# Patient Record
Sex: Male | Born: 1945
Health system: Southern US, Community
[De-identification: ages and names within clinical notes are randomized; demographics above are authoritative.]

## PROBLEM LIST (undated history)

## (undated) DIAGNOSIS — R0602 Shortness of breath: Secondary | ICD-10-CM

## (undated) DIAGNOSIS — I959 Hypotension, unspecified: Secondary | ICD-10-CM

## (undated) DIAGNOSIS — I739 Peripheral vascular disease, unspecified: Secondary | ICD-10-CM

## (undated) DIAGNOSIS — J189 Pneumonia, unspecified organism: Secondary | ICD-10-CM

## (undated) DIAGNOSIS — Z9289 Personal history of other medical treatment: Secondary | ICD-10-CM

## (undated) DIAGNOSIS — M1990 Unspecified osteoarthritis, unspecified site: Secondary | ICD-10-CM

## (undated) DIAGNOSIS — E785 Hyperlipidemia, unspecified: Secondary | ICD-10-CM

## (undated) DIAGNOSIS — I2510 Atherosclerotic heart disease of native coronary artery without angina pectoris: Secondary | ICD-10-CM

## (undated) DIAGNOSIS — I6529 Occlusion and stenosis of unspecified carotid artery: Secondary | ICD-10-CM

## (undated) DIAGNOSIS — I4891 Unspecified atrial fibrillation: Secondary | ICD-10-CM

## (undated) DIAGNOSIS — I495 Sick sinus syndrome: Secondary | ICD-10-CM

## (undated) DIAGNOSIS — N200 Calculus of kidney: Secondary | ICD-10-CM

## (undated) HISTORY — DX: Personal history of other medical treatment: Z92.89

## (undated) HISTORY — DX: Occlusion and stenosis of unspecified carotid artery: I65.29

## (undated) HISTORY — DX: Hyperlipidemia, unspecified: E78.5

## (undated) HISTORY — PX: CYSTOSCOPY/RETROGRADE/URETEROSCOPY/STONE EXTRACTION WITH BASKET: SHX5317

## (undated) HISTORY — DX: Peripheral vascular disease, unspecified: I73.9

---

## 2001-12-29 ENCOUNTER — Encounter: Payer: Self-pay | Admitting: Family Medicine

## 2001-12-29 ENCOUNTER — Ambulatory Visit (HOSPITAL_COMMUNITY): Admission: RE | Admit: 2001-12-29 | Discharge: 2001-12-29 | Payer: Self-pay | Admitting: Family Medicine

## 2003-08-27 ENCOUNTER — Ambulatory Visit (HOSPITAL_COMMUNITY): Admission: RE | Admit: 2003-08-27 | Discharge: 2003-08-27 | Payer: Self-pay | Admitting: Family Medicine

## 2004-11-24 ENCOUNTER — Ambulatory Visit (HOSPITAL_COMMUNITY): Admission: RE | Admit: 2004-11-24 | Discharge: 2004-11-24 | Payer: Self-pay | Admitting: Family Medicine

## 2005-12-29 ENCOUNTER — Ambulatory Visit (HOSPITAL_COMMUNITY): Admission: RE | Admit: 2005-12-29 | Discharge: 2005-12-29 | Payer: Self-pay | Admitting: Family Medicine

## 2007-08-31 ENCOUNTER — Ambulatory Visit (HOSPITAL_COMMUNITY): Admission: RE | Admit: 2007-08-31 | Discharge: 2007-08-31 | Payer: Self-pay | Admitting: Family Medicine

## 2007-10-25 ENCOUNTER — Emergency Department (HOSPITAL_COMMUNITY): Admission: EM | Admit: 2007-10-25 | Discharge: 2007-10-25 | Payer: Self-pay | Admitting: Emergency Medicine

## 2009-04-17 ENCOUNTER — Ambulatory Visit (HOSPITAL_COMMUNITY): Admission: RE | Admit: 2009-04-17 | Discharge: 2009-04-17 | Payer: Self-pay | Admitting: Family Medicine

## 2009-04-24 ENCOUNTER — Ambulatory Visit (HOSPITAL_COMMUNITY): Admission: RE | Admit: 2009-04-24 | Discharge: 2009-04-24 | Payer: Self-pay | Admitting: Family Medicine

## 2009-04-29 ENCOUNTER — Ambulatory Visit: Payer: Self-pay | Admitting: Vascular Surgery

## 2009-04-29 ENCOUNTER — Inpatient Hospital Stay (HOSPITAL_COMMUNITY): Admission: RE | Admit: 2009-04-29 | Discharge: 2009-05-02 | Payer: Self-pay | Admitting: Family Medicine

## 2009-05-01 ENCOUNTER — Encounter: Payer: Self-pay | Admitting: Vascular Surgery

## 2009-05-21 ENCOUNTER — Ambulatory Visit: Payer: Self-pay | Admitting: Vascular Surgery

## 2009-05-21 HISTORY — PX: CAROTID ENDARTERECTOMY: SUR193

## 2009-08-25 DIAGNOSIS — Z9289 Personal history of other medical treatment: Secondary | ICD-10-CM

## 2009-08-25 HISTORY — DX: Personal history of other medical treatment: Z92.89

## 2009-08-25 HISTORY — PX: CARDIAC CATHETERIZATION: SHX172

## 2009-09-01 ENCOUNTER — Inpatient Hospital Stay (HOSPITAL_COMMUNITY): Admission: EM | Admit: 2009-09-01 | Discharge: 2009-09-03 | Payer: Self-pay | Admitting: Emergency Medicine

## 2009-09-02 ENCOUNTER — Encounter (INDEPENDENT_AMBULATORY_CARE_PROVIDER_SITE_OTHER): Payer: Self-pay | Admitting: Cardiology

## 2009-11-05 ENCOUNTER — Ambulatory Visit: Payer: Self-pay | Admitting: Vascular Surgery

## 2010-11-05 ENCOUNTER — Ambulatory Visit: Admit: 2010-11-05 | Payer: Self-pay | Admitting: Vascular Surgery

## 2010-12-10 ENCOUNTER — Other Ambulatory Visit (INDEPENDENT_AMBULATORY_CARE_PROVIDER_SITE_OTHER): Payer: BC Managed Care – PPO

## 2010-12-10 DIAGNOSIS — Z48812 Encounter for surgical aftercare following surgery on the circulatory system: Secondary | ICD-10-CM

## 2010-12-10 DIAGNOSIS — I6529 Occlusion and stenosis of unspecified carotid artery: Secondary | ICD-10-CM

## 2010-12-25 NOTE — Procedures (Unsigned)
CAROTID DUPLEX EXAM  INDICATION:  Followup left carotid endarterectomy.  HISTORY: Diabetes:  no Cardiac:  Atrial fibrillation Hypertension:  yes Smoking:  yes Previous Surgery:  Left carotid endarterectomy 05/21/2009 CV History:  The patient is currently asymptomatic Amaurosis Fugax No, Paresthesias No, Hemiparesis No                                      RIGHT             LEFT Brachial systolic pressure:         114               120 Brachial Doppler waveforms:         WNL               WNL Vertebral direction of flow:        Antegrade         Antegrade DUPLEX VELOCITIES (cm/sec) CCA peak systolic                   79                70 ECA peak systolic                   83                68 ICA peak systolic                   84                120 ICA end diastolic                   33                33 PLAQUE MORPHOLOGY:                  Soft plaque, mixed PLAQUE AMOUNT:                      Mild PLAQUE LOCATION:                    CCA / ICA / ECA  IMPRESSION: 1. Right side no hemodynamically significant stenosis. 2. Left internal carotid artery is 40% to 59% stenosis.  This could be     secondary to vessel tortuosity.  Study stable compared to previous.  ___________________________________________ Janetta Hora Fields, MD  OD/MEDQ  D:  12/10/2010  T:  12/10/2010  Job:  [id number]

## 2011-01-27 LAB — DIFFERENTIAL
Basophils Relative: 1 % (ref 0–1)
Lymphs Abs: 2.6 10*3/uL (ref 0.7–4.0)
Monocytes Absolute: 0.5 10*3/uL (ref 0.1–1.0)
Monocytes Relative: 6 % (ref 3–12)
Neutro Abs: 5.5 10*3/uL (ref 1.7–7.7)

## 2011-01-27 LAB — BASIC METABOLIC PANEL
BUN: 16 mg/dL (ref 6–23)
CO2: 25 mEq/L (ref 19–32)
Calcium: 8.4 mg/dL (ref 8.4–10.5)
Calcium: 8.5 mg/dL (ref 8.4–10.5)
Chloride: 110 mEq/L (ref 96–112)
Creatinine, Ser: 1.28 mg/dL (ref 0.4–1.5)
GFR calc Af Amer: >60 mL/min (ref >60)
GFR calc Af Amer: >60 mL/min (ref >60)
GFR calc non Af Amer: 59 mL/min — ABNORMAL LOW (ref >60)
Potassium: 3.9 mEq/L (ref 3.5–5.1)
Sodium: 138 mEq/L (ref 135–145)

## 2011-01-27 LAB — URINALYSIS, ROUTINE W REFLEX MICROSCOPIC
Bilirubin Urine: NEGATIVE
Glucose, UA: NEGATIVE mg/dL
Hgb urine dipstick: NEGATIVE
Ketones, ur: NEGATIVE mg/dL
Protein, ur: NEGATIVE mg/dL

## 2011-01-27 LAB — CBC
Hemoglobin: 13.3 g/dL (ref 13.0–17.0)
Hemoglobin: 17.9 g/dL — ABNORMAL HIGH (ref 13.0–17.0)
MCV: 99.4 fL (ref 78.0–100.0)
RBC: 3.89 MIL/uL — ABNORMAL LOW (ref 4.22–5.81)
RBC: 5.18 MIL/uL (ref 4.22–5.81)
RDW: 13.2 % (ref 11.5–15.5)
WBC: 8.8 10*3/uL (ref 4.0–10.5)

## 2011-01-27 LAB — LIPID PANEL
HDL: 31 mg/dL — ABNORMAL LOW (ref >39)
VLDL: 13 mg/dL (ref 0–40)

## 2011-01-27 LAB — CARDIAC PANEL(CRET KIN+CKTOT+MB+TROPI)
CK, MB: 0.7 ng/mL (ref 0.3–4.0)
CK, MB: 0.8 ng/mL (ref 0.3–4.0)
CK, MB: 0.9 ng/mL (ref 0.3–4.0)
Relative Index: INVALID (ref 0.0–2.5)
Total CK: 13 U/L (ref 7–232)
Total CK: 21 U/L (ref 7–232)
Troponin I: 0.02 ng/mL (ref 0.00–0.06)
Troponin I: 0.03 ng/mL (ref 0.00–0.06)

## 2011-01-27 LAB — MAGNESIUM: Magnesium: 2.2 mg/dL (ref 1.5–2.5)

## 2011-01-27 LAB — COMPREHENSIVE METABOLIC PANEL
Albumin: 4.6 g/dL (ref 3.5–5.2)
Alkaline Phosphatase: 80 U/L (ref 39–117)
BUN: 14 mg/dL (ref 6–23)
Calcium: 9.3 mg/dL (ref 8.4–10.5)
Potassium: 3.9 mEq/L (ref 3.5–5.1)
Sodium: 140 mEq/L (ref 135–145)
Total Protein: 7.6 g/dL (ref 6.0–8.3)

## 2011-01-27 LAB — PROTIME-INR
INR: 0.99 (ref 0.00–1.49)
Prothrombin Time: 13 seconds (ref 11.6–15.2)

## 2011-01-27 LAB — HEPARIN LEVEL (UNFRACTIONATED): Heparin Unfractionated: 0.49 IU/mL (ref 0.30–0.70)

## 2011-01-27 LAB — HEMOGLOBIN A1C: Mean Plasma Glucose: 108 mg/dL

## 2011-01-27 LAB — CK TOTAL AND CKMB (NOT AT ARMC)
CK, MB: 1.3 ng/mL (ref 0.3–4.0)
Total CK: 36 U/L (ref 7–232)

## 2011-01-27 LAB — APTT: aPTT: 30 seconds (ref 24–37)

## 2011-01-31 LAB — CBC
HCT: 45.9 % (ref 39.0–52.0)
Hemoglobin: 15.3 g/dL (ref 13.0–17.0)
Hemoglobin: 17 g/dL (ref 13.0–17.0)
MCHC: 33.4 g/dL (ref 30.0–36.0)
MCHC: 34.3 g/dL (ref 30.0–36.0)
MCHC: 34.8 g/dL (ref 30.0–36.0)
MCHC: 34.8 g/dL (ref 30.0–36.0)
MCV: 96.6 fL (ref 78.0–100.0)
MCV: 96.6 fL (ref 78.0–100.0)
MCV: 97.5 fL (ref 78.0–100.0)
MCV: 97.5 fL (ref 78.0–100.0)
Platelets: 163 10*3/uL (ref 150–400)
RBC: 4.79 MIL/uL (ref 4.22–5.81)
RBC: 5.14 MIL/uL (ref 4.22–5.81)
RDW: 13.1 % (ref 11.5–15.5)
WBC: 10 10*3/uL (ref 4.0–10.5)

## 2011-01-31 LAB — BASIC METABOLIC PANEL
BUN: 10 mg/dL (ref 6–23)
CO2: 29 mEq/L (ref 19–32)
CO2: 29 mEq/L (ref 19–32)
Chloride: 101 mEq/L (ref 96–112)
Chloride: 104 mEq/L (ref 96–112)
Chloride: 104 mEq/L (ref 96–112)
Creatinine, Ser: 1.1 mg/dL (ref 0.4–1.5)
GFR calc Af Amer: >60 mL/min (ref >60)
Glucose, Bld: 100 mg/dL — ABNORMAL HIGH (ref 70–99)
Glucose, Bld: 100 mg/dL — ABNORMAL HIGH (ref 70–99)
Potassium: 4.1 mEq/L (ref 3.5–5.1)
Sodium: 137 mEq/L (ref 135–145)
Sodium: 138 mEq/L (ref 135–145)

## 2011-01-31 LAB — LIPID PANEL
Triglycerides: 119 mg/dL (ref <150)
VLDL: 24 mg/dL (ref 0–40)

## 2011-01-31 LAB — TYPE AND SCREEN: Antibody Screen: NEGATIVE

## 2011-01-31 LAB — HEPARIN LEVEL (UNFRACTIONATED)
Heparin Unfractionated: 0.22 IU/mL — ABNORMAL LOW (ref 0.30–0.70)
Heparin Unfractionated: <0.1 IU/mL — ABNORMAL LOW (ref 0.30–0.70)

## 2011-03-09 NOTE — Assessment & Plan Note (Signed)
OFFICE VISIT   [name], Shawn [name]  DOB:  1946-07-10                                       05/21/2009  CHART#:[id number]   The patient returns for followup today after his left carotid  endarterectomy, which was done for symptoms of amaurosis fugax and right  arm weakness and numbness.  He had an uneventful postoperative stay.  He  returns for further followup today.  He denies any stroke or TIA  symptoms.  He has had no further episodes of amaurosis.  He does  complain of some peri-incisional numbness.   PHYSICAL EXAM TODAY:  Blood pressure is 124/73 in the left arm, pulse is  56 and regular.  Neck incision is healing well.  There is some peri-  incisional numbness extending all the way up to the left side of the  mandible.  Otherwise, neurologically he is intact.  Tongue is midline.  Swallow is intact.  Upper extremity and lower extremity motor strength  is 5/5 and symmetric bilaterally.   The patient was advised to continue taking his aspirin 325 mg once a  day.  I also counseled him at length again today about smoking  cessation.  I also told him he should make sure that his primary care  doctor is following his cholesterol long-term as this would be a risk  factor for recurrence of carotid disease as well.  He will follow up  with [place] in 6 months' time for repeat carotid duplex exam.   Janetta Hora. Fields, MD  Electronically Signed   CEF/MEDQ  D:  05/21/2009  T:  05/22/2009  Job:  [id number]   cc:   Kirk Ruths, M.D.

## 2011-03-09 NOTE — Op Note (Signed)
NAME:  Shawn [name], Shawn [name]           ACCOUNT NO.:  [id number]   MEDICAL RECORD NO.:  [id number]          PATIENT TYPE:  INP   LOCATION:  3304                         FACILITY:  [facility]   PHYSICIAN:  Janetta Hora. Fields, MD  DATE OF BIRTH:  1946/03/04   DATE OF PROCEDURE:  05/01/2009  DATE OF DISCHARGE:                               OPERATIVE REPORT   PROCEDURE:  Left carotid endarterectomy.   PREOPERATIVE DIAGNOSIS:  Symptomatic left internal carotid artery  stenosis.   POSTOPERATIVE DIAGNOSIS:  Symptomatic left internal carotid artery  stenosis.   ANESTHESIA:  General.   ASSISTANT:  Jerold Coombe, PA-C   OPERATIVE FINDINGS:  1. 10-French shunt.  2. Greater than 90% friable plaque left carotid artery.  3. Dacron patch.   OPERATIVE DETAILS:  After obtaining informed consent, the patient was  taken to the operating room.  The patient was placed in supine position  on the operating table.  After induction of general anesthesia and  endotracheal intubation, the patient's entire left neck and chest were  prepped and draped in usual sterile fashion.  Next an oblique incision  was made on the left side of the neck just anterior to the border of the  left sternocleidomastoid muscle.  This was carried down through the  subcutaneous tissues and platysma.  Sternocleidomastoid muscle was  identified and reflected laterally.  Dissection was carried down along  the anterior border of this muscle.  The common facial vein was  identified and dissected free circumferentially and ligated and divided  between silk ties.  The vein was then reflected laterally.  Common  carotid artery was dissected and freed circumferentially.  An umbilical  tape was placed around this.  The ansa cervicalis was identified and  used to trace all the way up to level of the hypoglossal nerve.  Both  these nerves were protected and kept from harm's way.  The distal  internal carotid artery was dissected free  circumferentially  approximately 2 cm below the hypoglossal nerve.  The artery was soft in  character in this location.  The vessel loop was placed around this.  The external carotid and superior thyroid arteries were dissected free  circumferentially and vessel loops were placed around these.  The  patient was then given 7000 units of intravenous heparin.  After  appropriate circulation time, the distal internal carotid artery was  controlled with a fine bulldog clamp.  The external carotid artery was  controlled with a vessel loop.  The common carotid artery was controlled  with peripheral DeBakey clamp.  A longitudinal arteriotomy was then made  in the left carotid artery just below the carotid bifurcation.  This  arteriotomy was extended through the carotid bifurcation with Potts  scissors.  There was greater than 90% stenosis with very friable plaque  and hemorrhage present.  Next, a 10-French shunt was brought up in the  operative field and threaded into the distal internal carotid artery and  allowed to back bleed thoroughly.  It was then threaded down to the  common carotid artery and then flow was restored through the shunt after  securing the shunt with a Rumel tourniquet.  Ischemia time of the brain  was approximately 4 minutes.  Next, endarterectomy was begun in a  suitable plane just below the carotid bifurcation.  A good distal and  proximal endpoint was obtained.  All loose debris was then removed from  the carotid bed.  This was thoroughly irrigated with heparinized saline.  A Dacron patch was then brought up in the operative field and sewn on as  a patch angioplasty using running 6-0 Prolene suture.  Just prior to  completion of anastomosis, this was fore-bled, back-bled and thoroughly  flushed.  The anastomosis was secured after removing the shunt.  Everything was thoroughly back-bled and fore-bled prior to securing the  anastomosis.  After the anastomosis was secured,  flow was first restored  up the external carotid artery after approximately 10 cardiac cycles to  the internal carotid artery.  Doppler was then used to examine the  arteries and there was good flow in the internal, external and common  carotid arteries.  The patient was given 50 mg of protamine.  Hemostasis  was obtained.  Platysma muscle was reapproximated using running 3-0  Vicryl suture.  Skin was closed with 4-0 Vicryl subcuticular stitch.  The patient tolerated the procedure well and there were no  complications.  Instrument, sponge and needle counts were correct at the  end of the case.  The patient was taken to the recovery room in stable  condition.  The patient was moving the upper extremities and lower  extremities symmetrically with 5/5 motor strength at the end of the  case.      Janetta Hora. Fields, MD  Electronically Signed     CEF/MEDQ  D:  05/01/2009  T:  05/02/2009  Job:  [id number]

## 2011-03-09 NOTE — Procedures (Signed)
CAROTID DUPLEX EXAM   INDICATION:  Follow up left carotid endarterectomy.   HISTORY:  Diabetes:  No.  Cardiac:  A fib.  Hypertension:  Yes.  Smoking:  Yes.  Previous Surgery:  Left carotid endarterectomy.  CV History:  Amaurosis Fugax No, Paresthesias No, Hemiparesis No.                                       RIGHT             LEFT  Brachial systolic pressure:         118               118  Brachial Doppler waveforms:         Biphasic          Biphasic  Vertebral direction of flow:        Antegrade         Antegrade  DUPLEX VELOCITIES (cm/sec)  CCA peak systolic                   85                77  ECA peak systolic                   101               144  ICA peak systolic                   101               118  ICA end diastolic                   51                44  PLAQUE MORPHOLOGY:                  Heterogenous      None  PLAQUE AMOUNT:                      Mild              None  PLAQUE LOCATION:                    ICA, ECA          None   IMPRESSION:  1. 1-39% stenosis noted in the bilateral internal carotid arteries,      status post left carotid endarterectomy.  2. Antegrade bilateral vertebral arteries.   ___________________________________________  Janetta Hora Fields, MD   MG/MEDQ  D:  11/05/2009  T:  11/05/2009  Job:  [id number]

## 2011-03-09 NOTE — Assessment & Plan Note (Signed)
OFFICE VISIT   Shawn [name], Shawn [name]  DOB:  [birth year]                                       11/05/2009  CHART#:[id number]   The patient returns for followup today.  He previously underwent left  carotid endarterectomy in July of 2010.  He states that since that time  he has had no history of TIA, amaurosis or stroke type symptoms.  Unfortunately he continues to smoke 1 pack of cigarettes per day.  Greater than 3 minutes was spent today regarding smoking cessation  counseling and several different opportunities and techniques to quit  smoking including nicotine patches, other medications or smoking  cessation therapy.  He will try to continue to quit smoking.  He denies  any other significant neurologic symptoms.  However, he does complain of  easy bruisability since he is currently on Plavix and aspirin.   REVIEW OF SYSTEMS:  He also has some occasional dyspnea with exertion.  He also has some occasional dysphagia.   PHYSICAL EXAM:  Vital signs:  Blood pressure is 112/78 in the left arm,  109/74 in the right arm, heart rate 55 and regular.  Temperature is 98.  HEENT:  Unremarkable.  Neck:  Has 2+ carotid pulses without bruit.  He  has a well-healed left neck scar.  Chest:  Clear to auscultation.  Cardiac:  Exam is regular rate and rhythm.  Neurological:  Shows  symmetric upper extremity and lower extremity motor strength which is  5/5 and symmetric.  He does have still some decreased sensation on the  anterior aspect of his left neck.   He had a carotid duplex ultrasound today which showed less than 40%  stenosis in the internal carotid artery bilaterally, widely patent left  carotid endarterectomy site.   I had a discussion with the patient today regarding his antiplatelet  therapy.  He would like to come off of Plavix due to the easy  bruisability that he currently experiences.  He is currently taking  aspirin and Plavix.  I have discussed with him  that I think the benefit  of the two combined together is probably only marginal compared to the  problem he is suffering with easy bruisability.  He has not been able to  take a full aspirin in the past due to GI upset.  He has been able to  take a baby aspirin in the past.  As of today we will stop his Plavix  and leave him on one 81 mg aspirin alone.  He will follow up in our  carotid surveillance protocol to make sure that he has not had further  narrowing over time.  I told him I would inform Dr. Lynnea Ferrier and Dr.  Regino Schultze also that we were going to stop his Plavix.     Janetta Hora. Fields, MD  Electronically Signed   CEF/MEDQ  D:  11/05/2009  T:  11/06/2009  Job:  [id number]   cc:   Kirk Ruths, M.D.  Ritta Slot, MD

## 2011-03-09 NOTE — Discharge Summary (Signed)
NAME:  Shawn [name], Shawn [name]           ACCOUNT NO.:  [id number]   MEDICAL RECORD NO.:  [id number]          PATIENT TYPE:  INP   LOCATION:  3304                         FACILITY:  [facility]   PHYSICIAN:  Janetta Hora. Fields, MD  DATE OF BIRTH:  June 15, 1946   DATE OF ADMISSION:  04/29/2009  DATE OF DISCHARGE:  05/02/2009                               DISCHARGE SUMMARY   ADMISSION DIAGNOSIS:  Symptomatic left internal carotid artery stenosis.   FINAL DISCHARGE DIAGNOSES:  1. Symptomatic left internal carotid artery stenosis status post left      carotid endarterectomy.  2. History of nephrolithiasis.  3. Postoperative hypotension, improved.  4. Postoperative bradycardia, asymptomatic with underlying heart rate      around 50.  5. Dyslipidemia.  6. Ongoing tobacco abuse.  7. Mild postoperative hypokalemia, supplemented.   PROCEDURES:  On May 01, 2009, the patient underwent left carotid  endarterectomy with Dacron patch angioplasty.   BRIEF HISTORY:  Shawn [name] is a 65 year old Caucasian male with 2-  month history of episodes of cloudiness of the left eye and several  episodes of right arm numbness and 2 episodes of aphasia, both occurring  over the last 2 weeks.  Each episode lasted less than 2-3 minutes.  Risk  factors included tobacco abuse.  He underwent an MRI of the head showing  areas of restricted diffusion involving the left parietal occipital  lobe, parietooccipital, and left posterior frontal-parietal regions,  involving the left posterior cerebral artery and the left middle  cerebral watershed zone most consistent with acute to subacute ischemia,  possibly from a more proximal source.  There was attenuating caliber  with modestly increased signal intensity involving the left ICA at the  skull base, probable old ischemic changes involving the right cerebellar  hemisphere and left carotid radiata.  There is nonspecific subcortical  white matter changes supratentorially, probably  this is sequale of small  vessel disease due to hypertension or diabetes with demyelinating  process and vasculitis being less likely.  This was read by radiologist,  Dr. Corliss Skains.  Subsequently underwent cerebral arteriography which  showed severe 95% plus stenosis of the left ICA at the bulb associated  with filling deficit and there was partial reconstitution of the left  anterior and left middle cerebral artery from the right internal carotid  artery via the anterior communicating artery.  He was subsequently  referred to Dr. Fabienne Bruns who felt he should undergo left carotid  endarterectomy.  He was admitted after being evaluated by [name] on  April 29, 2009.   HOSPITAL COURSE:  Shawn [name] was admitted to [facility] on  April 29, 2009.  He was started on IV heparin and ultimately underwent  left carotid endarterectomy on May 01, 2009.  Postoperatively, he did  have a heart rate primarily in the 40-50 range overnight.  He also had  some hypotension with systolic in the 80s which responded to fluid  bolus.  He was asymptomatic of his bradycardia.  On postoperative day #1  with activity, his heart rate did primarily maintained in the 50s.  We  did request a  comparison EKG which had been done at [facility] and these findings were consistent with his sinus bradycardia  with no significant changes between the 2 EKGs.  That morning as well he  was able to ambulate, void, and tolerate food.  He denied dysphagia.  Lungs were clear.  Abdomen was soft.  Tongue was midline.  Incision  showed no evidence of hematoma.  We did have to supplement him for mild  hypokalemia.  We kept him to the morning of postoperative day #1 to  further monitor his blood pressure and heart rate.  However, these  remained stable and ultimately felt appropriate for discharge home on  May 02, 2009.  He was felt to be stable and in improving condition at  discharge.   LABORATORY DATA:   Postoperative labs showed sodium 134, potassium 3.4,  glucose of 109, BUN of 10, creatinine 1.10.  White count of 10,  hemoglobin 13.9, hematocrit of 39.9, and platelet count 163.  He has  also had a lipid profile which showed a total cholesterol of 178,  triglycerides 119, HDL 31, LDL 123, VLDL of 24, and total cholesterol to  HDL ratio 5.7.   DISCHARGE MEDICATIONS:  1. Percocet 5/325 mg 1-2 tablets p.o. q.4 h p.r.n. pain.  2. Coated aspirin 325 mg daily.   DISCHARGE INSTRUCTIONS:  Continue a heart-healthy diet and may shower  and clean incisions gently with soap and water.  Avoid driving or  lifting for the next 2 weeks.  See [name] in 2-3 weeks.  He should  call sooner if he has fever greater than 101, redness or drainage from  his incision site, severe headache, or new neurologic symptoms.  He  should follow up with his primary physician, Dr. Regino Schultze at [facility] for followup on his dyslipidemia.      Jerold Coombe, P.A.      Janetta Hora. Fields, MD  Electronically Signed    AWZ/MEDQ  D:  05/02/2009  T:  05/03/2009  Job:  [id number]   cc:   Kirk Ruths, M.D.

## 2011-12-09 ENCOUNTER — Other Ambulatory Visit (INDEPENDENT_AMBULATORY_CARE_PROVIDER_SITE_OTHER): Payer: BC Managed Care – PPO | Admitting: *Deleted

## 2011-12-09 DIAGNOSIS — I6529 Occlusion and stenosis of unspecified carotid artery: Secondary | ICD-10-CM

## 2011-12-09 DIAGNOSIS — Z48812 Encounter for surgical aftercare following surgery on the circulatory system: Secondary | ICD-10-CM

## 2011-12-17 ENCOUNTER — Encounter: Payer: Self-pay | Admitting: Vascular Surgery

## 2011-12-17 NOTE — Procedures (Unsigned)
CAROTID DUPLEX EXAM  INDICATION:  Carotid endarterectomy.  HISTORY: Diabetes:  No. Cardiac:  A-fib. Hypertension:  Yes. Smoking:  Yes. Previous Surgery:  Left carotid endarterectomy on 05/21/2009. CV History:  Currently asymptomatic. Amaurosis Fugax No, Paresthesias No, Hemiparesis No                                      RIGHT             LEFT Brachial systolic pressure:         116               122 Brachial Doppler waveforms:         Normal            Normal Vertebral direction of flow:        Antegrade         Antegrade DUPLEX VELOCITIES (cm/sec) CCA peak systolic                   72                79 ECA peak systolic                   84                142 ICA peak systolic                   67                82 ICA end diastolic                   29                19 PLAQUE MORPHOLOGY:                  Heterogeneous PLAQUE AMOUNT:                      Mild              None PLAQUE LOCATION:                    ICA  IMPRESSION: 1. Patent left carotid endarterectomy site with no hemodynamically     stenoses of the bilateral internal carotid arteries. 2. No significant change noted when compared to the previous exam on     12/10/2010.  ___________________________________________ Janetta Hora. Fields, MD  CH/MEDQ  D:  12/09/2011  T:  12/09/2011  Job:  [id number]

## 2012-03-25 HISTORY — PX: TRANSTHORACIC ECHOCARDIOGRAM: SHX275

## 2012-04-06 ENCOUNTER — Emergency Department (HOSPITAL_COMMUNITY): Payer: BC Managed Care – PPO

## 2012-04-06 ENCOUNTER — Emergency Department (HOSPITAL_COMMUNITY)
Admission: EM | Admit: 2012-04-06 | Discharge: 2012-04-07 | Disposition: A | Discharge status: Home / Self Care | Payer: BC Managed Care – PPO | Attending: Emergency Medicine | Admitting: Emergency Medicine

## 2012-04-06 ENCOUNTER — Encounter (HOSPITAL_COMMUNITY): Payer: Self-pay | Admitting: *Deleted

## 2012-04-06 DIAGNOSIS — Z886 Allergy status to analgesic agent status: Secondary | ICD-10-CM | POA: Insufficient documentation

## 2012-04-06 DIAGNOSIS — Z79899 Other long term (current) drug therapy: Secondary | ICD-10-CM | POA: Insufficient documentation

## 2012-04-06 DIAGNOSIS — Z880 Allergy status to penicillin: Secondary | ICD-10-CM | POA: Insufficient documentation

## 2012-04-06 DIAGNOSIS — I4891 Unspecified atrial fibrillation: Secondary | ICD-10-CM | POA: Insufficient documentation

## 2012-04-06 DIAGNOSIS — E7800 Pure hypercholesterolemia, unspecified: Secondary | ICD-10-CM | POA: Insufficient documentation

## 2012-04-06 DIAGNOSIS — I480 Paroxysmal atrial fibrillation: Secondary | ICD-10-CM

## 2012-04-06 DIAGNOSIS — F17200 Nicotine dependence, unspecified, uncomplicated: Secondary | ICD-10-CM | POA: Insufficient documentation

## 2012-04-06 DIAGNOSIS — Z7982 Long term (current) use of aspirin: Secondary | ICD-10-CM | POA: Insufficient documentation

## 2012-04-06 LAB — CBC
HCT: 47 % (ref 39.0–52.0)
Platelets: 234 10*3/uL (ref 150–400)
RDW: 13.4 % (ref 11.5–15.5)
WBC: 14.9 10*3/uL — ABNORMAL HIGH (ref 4.0–10.5)

## 2012-04-06 LAB — POCT I-STAT, CHEM 8
BUN: 18 mg/dL (ref 6–23)
Calcium, Ion: 1.19 mmol/L (ref 1.12–1.32)
Chloride: 105 mEq/L (ref 96–112)
HCT: 48 % (ref 39.0–52.0)
Potassium: 3.5 mEq/L (ref 3.5–5.1)
Sodium: 145 mEq/L (ref 135–145)

## 2012-04-06 LAB — POCT I-STAT TROPONIN I: Troponin i, poc: 0 ng/mL (ref 0.00–0.08)

## 2012-04-06 MED ORDER — SODIUM CHLORIDE 0.9 % IV SOLN
INTRAVENOUS | Status: DC
  Administered 2012-04-06: 23:00:00 via INTRAVENOUS

## 2012-04-06 NOTE — ED Notes (Signed)
Family at bedside at patient request.

## 2012-04-06 NOTE — ED Notes (Signed)
MD at bedside. 

## 2012-04-06 NOTE — ED Notes (Signed)
Rapid heart rate, denies CP or SOB
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
## 2012-04-07 MED ORDER — DILTIAZEM HCL ER 60 MG PO CP12: 60.0000 mg | ORAL_CAPSULE | Freq: Two times a day (BID) | ORAL | Status: DC

## 2012-04-07 NOTE — Discharge Instructions (Signed)
Atrial Fibrillation increase your diltiazem 60 mg twice daily.   Follow up with your cardiologist as discussed.  Your caregiver has diagnosed you with atrial fibrillation (AFib). The heart normally beats very regularly; AFib is a type of irregular heartbeat. The heart rate may be faster or slower than normal. This can prevent your heart from pumping as well as it should. AFib can be constant (chronic) or intermittent (paroxysmal). CAUSES  Atrial fibrillation may be caused by:  Heart disease, including heart attack, coronary artery disease, heart failure, diseases of the heart valves, and others.   Blood clot in the lungs (pulmonary embolism).   Pneumonia or other infections.   Chronic lung disease.   Thyroid disease.   Toxins. These include alcohol, some medications (such as decongestant medications or diet pills), and caffeine.  In some people, no cause for AFib can be found. This is referred to as Lone Atrial Fibrillation. SYMPTOMS   Palpitations or a fluttering in your chest.   A vague sense of chest discomfort.   Shortness of breath.   Sudden onset of lightheadedness or weakness.  Sometimes, the first sign of AFib can be a complication of the condition. This could be a stroke or heart failure. DIAGNOSIS  Your description of your condition may make your caregiver suspicious of atrial fibrillation. Your caregiver will examine your pulse to determine if fibrillation is present. An EKG (electrocardiogram) will confirm the diagnosis. Further testing may help determine what caused you to have atrial fibrillation. This may include chest x-ray, echocardiogram, blood tests, or CT scans. PREVENTION  If you have previously had atrial fibrillation, your caregiver may advise you to avoid substances known to cause the condition (such as stimulant medications, and possibly caffeine or alcohol). You may be advised to use medications to prevent recurrence. Proper treatment of any underlying  condition is important to help prevent recurrence. PROGNOSIS  Atrial fibrillation does tend to become a chronic condition over time. It can cause significant complications (see below). Atrial fibrillation is not usually immediately life-threatening, but it can shorten your life expectancy. This seems to be worse in women. If you have lone atrial fibrillation and are under 30 years old, the risk of complications is very low, and life expectancy is not shortened. RISKS AND COMPLICATIONS  Complications of atrial fibrillation can include stroke, chest pain, and heart failure. Your caregiver will recommend treatments for the atrial fibrillation, as well as for any underlying conditions, to help minimize risk of complications. TREATMENT  Treatment for AFib is divided into several categories:  Treatment of any underlying condition.   Converting you out of AFib into a regular (sinus) rhythm.   Controlling rapid heart rate.   Prevention of blood clots and stroke.  Medications and procedures are available to convert your atrial fibrillation to sinus rhythm. However, recent studies have shown that this may not offer you any advantage, and cardiac experts are continuing research and debate on this topic. More important is controlling your rapid heartbeat. The rapid heartbeat causes more symptoms, and places strain on your heart. Your caregiver will advise you on the use of medications that can control your heart rate. Atrial fibrillation is a strong stroke risk. You can lessen this risk by taking blood thinning medications such as Coumadin (warfarin), or sometimes aspirin. These medications need close monitoring by your caregiver. Over-medication can cause bleeding. Too little medication may not protect against stroke. HOME CARE INSTRUCTIONS   If your caregiver prescribed medicine to make your heartbeat more normally,  take as directed.   If blood thinners were prescribed by your caregiver, take EXACTLY as  directed.   Perform blood tests EXACTLY as directed.   Quit smoking. Smoking increases your cardiac and lung (pulmonary) risks.   DO NOT drink alcohol.   DO NOT drink caffeinated drinks (e.g. coffee, soda, chocolate, and leaf teas). You may drink decaffeinated coffee, soda or tea.   If you are overweight, you should choose a reduced calorie diet to lose weight. Please see a registered dietitian if you need more information about healthy weight loss. DO NOT USE DIET PILLS as they may aggravate heart problems.   If you have other heart problems that are causing AFib, you may need to eat a low salt, fat, and cholesterol diet. Your caregiver will tell you if this is necessary.   Exercise every day to improve your physical fitness. Stay active unless advised otherwise.   If your caregiver has given you a follow-up appointment, it is very important to keep that appointment. Not keeping the appointment could result in heart failure or stroke. If there is any problem keeping the appointment, you must call back to this facility for assistance.  SEEK MEDICAL CARE IF:  You notice a change in the rate, rhythm or strength of your heartbeat.   You develop an infection or any other change in your overall health status.  SEEK IMMEDIATE MEDICAL CARE IF:   You develop chest pain, abdominal pain, sweating, weakness or feel sick to your stomach (nausea).   You develop shortness of breath.   You develop swollen feet and ankles.   You develop dizziness, numbness, or weakness of your face or limbs, or any change in vision or speech.  MAKE SURE YOU:   Understand these instructions.   Will watch your condition.   Will get help right away if you are not doing well or get worse.

## 2012-04-10 ENCOUNTER — Observation Stay (HOSPITAL_COMMUNITY)
Admission: EM | Admit: 2012-04-10 | Discharge: 2012-04-11 | DRG: 139 | Disposition: A | Discharge status: Home / Self Care | Payer: BC Managed Care – PPO | Attending: Cardiovascular Disease | Admitting: Cardiovascular Disease

## 2012-04-10 ENCOUNTER — Encounter (HOSPITAL_COMMUNITY): Payer: Self-pay | Admitting: Emergency Medicine

## 2012-04-10 DIAGNOSIS — I4892 Unspecified atrial flutter: Principal | ICD-10-CM | POA: Insufficient documentation

## 2012-04-10 DIAGNOSIS — I959 Hypotension, unspecified: Secondary | ICD-10-CM | POA: Diagnosis not present

## 2012-04-10 DIAGNOSIS — Z9889 Other specified postprocedural states: Secondary | ICD-10-CM

## 2012-04-10 DIAGNOSIS — I495 Sick sinus syndrome: Secondary | ICD-10-CM | POA: Diagnosis present

## 2012-04-10 DIAGNOSIS — I2510 Atherosclerotic heart disease of native coronary artery without angina pectoris: Secondary | ICD-10-CM | POA: Diagnosis present

## 2012-04-10 DIAGNOSIS — I739 Peripheral vascular disease, unspecified: Secondary | ICD-10-CM | POA: Insufficient documentation

## 2012-04-10 DIAGNOSIS — F17200 Nicotine dependence, unspecified, uncomplicated: Secondary | ICD-10-CM | POA: Insufficient documentation

## 2012-04-10 DIAGNOSIS — Z720 Tobacco use: Secondary | ICD-10-CM | POA: Diagnosis present

## 2012-04-10 DIAGNOSIS — I480 Paroxysmal atrial fibrillation: Secondary | ICD-10-CM | POA: Diagnosis present

## 2012-04-10 HISTORY — DX: Sick sinus syndrome: I49.5

## 2012-04-10 HISTORY — DX: Unspecified atrial fibrillation: I48.91

## 2012-04-10 HISTORY — DX: Atherosclerotic heart disease of native coronary artery without angina pectoris: I25.10

## 2012-04-10 HISTORY — DX: Hypotension, unspecified: I95.9

## 2012-04-10 HISTORY — DX: Shortness of breath: R06.02

## 2012-04-10 HISTORY — DX: Pneumonia, unspecified organism: J18.9

## 2012-04-10 LAB — DIFFERENTIAL
Basophils Absolute: 0.1 10*3/uL (ref 0.0–0.1)
Eosinophils Relative: 2 % (ref 0–5)
Lymphocytes Relative: 19 % (ref 12–46)
Lymphs Abs: 2.5 10*3/uL (ref 0.7–4.0)
Monocytes Absolute: 0.9 10*3/uL (ref 0.1–1.0)
Monocytes Relative: 7 % (ref 3–12)
Neutro Abs: 9 10*3/uL — ABNORMAL HIGH (ref 1.7–7.7)

## 2012-04-10 LAB — PROTIME-INR
INR: 1.01 (ref 0.00–1.49)
Prothrombin Time: 13.5 seconds (ref 11.6–15.2)

## 2012-04-10 LAB — CBC
HCT: 49.2 % (ref 39.0–52.0)
Hemoglobin: 17.4 g/dL — ABNORMAL HIGH (ref 13.0–17.0)
MCV: 92 fL (ref 78.0–100.0)
RBC: 5.35 MIL/uL (ref 4.22–5.81)
RDW: 13.2 % (ref 11.5–15.5)
WBC: 12.7 10*3/uL — ABNORMAL HIGH (ref 4.0–10.5)

## 2012-04-10 LAB — BASIC METABOLIC PANEL
CO2: 25 mEq/L (ref 19–32)
Calcium: 9.4 mg/dL (ref 8.4–10.5)
Chloride: 106 mEq/L (ref 96–112)
Creatinine, Ser: 1.14 mg/dL (ref 0.50–1.35)
Glucose, Bld: 97 mg/dL (ref 70–99)

## 2012-04-10 LAB — CARDIAC PANEL(CRET KIN+CKTOT+MB+TROPI)
Relative Index: INVALID (ref 0.0–2.5)
Total CK: 34 U/L (ref 7–232)

## 2012-04-10 MED ORDER — PHENYLEPHRINE HCL 1 % NA SOLN
1.0000 [drp] | Freq: Three times a day (TID) | NASAL | Status: DC
  Administered 2012-04-10: 1 [drp] via NASAL
  Filled 2012-04-10: qty 15

## 2012-04-10 MED ORDER — SODIUM CHLORIDE 0.9 % IV BOLUS (SEPSIS)
500.0000 mL | Freq: Once | INTRAVENOUS | Status: AC
  Administered 2012-04-10: 500 mL via INTRAVENOUS

## 2012-04-10 MED ORDER — DILTIAZEM HCL 50 MG/10ML IV SOLN
10.0000 mg | Freq: Once | INTRAVENOUS | Status: AC
  Administered 2012-04-10: 10 mg via INTRAVENOUS
  Filled 2012-04-10: qty 2

## 2012-04-10 MED ORDER — OFF THE BEAT BOOK
Freq: Once | Status: AC
  Administered 2012-04-11: 07:00:00
  Filled 2012-04-10: qty 1

## 2012-04-10 MED ORDER — HEPARIN (PORCINE) IN NACL 100-0.45 UNIT/ML-% IJ SOLN
1150.0000 [IU]/h | INTRAMUSCULAR | Status: DC
  Administered 2012-04-10 – 2012-04-11 (×2): 1150 [IU]/h via INTRAVENOUS
  Filled 2012-04-10 (×3): qty 250

## 2012-04-10 MED ORDER — SIMVASTATIN 20 MG PO TABS
20.0000 mg | ORAL_TABLET | Freq: Every evening | ORAL | Status: DC
  Filled 2012-04-10: qty 1

## 2012-04-10 MED ORDER — DEXTROSE 5 % IV SOLN
5.0000 mg/h | INTRAVENOUS | Status: DC
  Administered 2012-04-10: 5 mg/h via INTRAVENOUS
  Filled 2012-04-10: qty 100

## 2012-04-10 MED ORDER — SODIUM CHLORIDE 0.9 % IV SOLN
INTRAVENOUS | Status: DC
  Administered 2012-04-10: 23:00:00 via INTRAVENOUS

## 2012-04-10 MED ORDER — ATORVASTATIN CALCIUM 10 MG PO TABS
10.0000 mg | ORAL_TABLET | Freq: Every day | ORAL | Status: DC
  Administered 2012-04-10: 10 mg via ORAL
  Filled 2012-04-10 (×2): qty 1

## 2012-04-10 MED ORDER — ASPIRIN EC 81 MG PO TBEC
81.0000 mg | DELAYED_RELEASE_TABLET | Freq: Every day | ORAL | Status: DC
  Administered 2012-04-11: 81 mg via ORAL
  Filled 2012-04-10: qty 1

## 2012-04-10 MED ORDER — HEPARIN BOLUS VIA INFUSION
4000.0000 [IU] | Freq: Once | INTRAVENOUS | Status: AC
  Administered 2012-04-10: 4000 [IU] via INTRAVENOUS

## 2012-04-10 NOTE — Progress Notes (Signed)
ANTICOAGULATION CONSULT NOTE - Initial Consult  Pharmacy Consult for heparin Indication: atrial fibrillation  Allergies  Allergen Reactions  . Aleve (Naproxen Sodium) Anaphylaxis and Shortness Of Breath  . Penicillins Hives and Itching    Patient Measurements:   Heparin Dosing Weight: 75kg  Vital Signs: Temp: 97.6 F (36.4 C) (06/17 0935) Temp src: Oral (06/17 0935) BP: 109/84 mmHg (06/17 1129) Pulse Rate: 79  (06/17 1129)  Labs:  Basename 04/10/12 1002 04/10/12 0958  HGB -- 17.4*  HCT -- 49.2  PLT -- 245  APTT -- --  LABPROT -- 13.5  INR -- 1.01  HEPARINUNFRC -- --  CREATININE -- 1.14  CKTOTAL 34 --  CKMB 1.2 --  TROPONINI <0.30 --    The CrCl is unknown because both a height and weight (above a minimum accepted value) are required for this calculation.   Medical History: Past Medical History  Diagnosis Date  . A-fib   . High cholesterol   . Atrial fibrillation     Medications:  Scheduled:    . diltiazem  10 mg Intravenous Once    Assessment: 53 YOM presents with rapid heart rate.  Found to be in Afib, has h/o PAF.  On diltiazem gtt currently, orders to start heparin gtt.  Not on anticoagulation prior to admit. Baseline CBC acceptable to start heparin.   Goal of Therapy:  Heparin level 0.3-0.7 units/ml Monitor platelets by anticoagulation protocol: Yes   Plan:  1. Heparin 4000 unit bolus then heparin 1150 units/hr 2. Check heparin level in 6h 3. Daily heparin level and CBC 4. Await plans for longer term anticoagulation tx.   Dannielle Huh 04/10/2012,12:04 PM

## 2012-04-10 NOTE — ED Notes (Signed)
Patient placed on zoll and waiting transport to [phone]. Patient resting with NAD at this time.

## 2012-04-10 NOTE — ED Notes (Signed)
Patient remains on monitor and sats of 98% with NAD at this time. Patient tolerating medication well with HR of 78.

## 2012-04-10 NOTE — ED Provider Notes (Signed)
History     CSN: [id number]  Arrival date & time 04/10/12  0920   First MD Initiated Contact with Patient 04/10/12 [phone]      Chief Complaint  Patient presents with  . Tachycardia    (Consider location/radiation/quality/duration/timing/severity/associated sxs/prior treatment) HPI Comments: Patient presents with rapid heartbeat, dizziness, shortness of breath as been intermittent for the past 2 days. His similar symptoms on Friday was seen at [facility]. He had A. fib with RVR but had converted to sinus rhythm before evaluation. He saw his cardiologist at [facility] and was also in sinus rhythm. His Cardizem was increased to twice a day dosing. He denies any chest pain, lightheadedness, nausea or vomiting. He does endorse some dizziness and shortness of breath with exertion. He is not on anticoagulation.  The history is provided by the patient.    Past Medical History  Diagnosis Date  . A-fib   . High cholesterol   . Atrial fibrillation   . Coronary artery disease   . Pneumonia     hx of   . Shortness of breath     Past Surgical History  Procedure Date  . Carotid endarterectomy     History reviewed. No pertinent family history.  History  Substance Use Topics  . Smoking status: Current Everyday Smoker -- 2.0 packs/day for 50 years    Types: Cigarettes  . Smokeless tobacco: Never Used  . Alcohol Use: No      Review of Systems  Constitutional: Positive for activity change, appetite change and fatigue. Negative for fever.  HENT: Negative for congestion and rhinorrhea.   Eyes: Negative for visual disturbance.  Respiratory: Negative for cough, chest tightness and shortness of breath.   Cardiovascular: Positive for palpitations. Negative for chest pain.  Gastrointestinal: Negative for nausea, vomiting and abdominal pain.  Genitourinary: Negative for dysuria and urgency.  Musculoskeletal: Negative for back pain.  Skin: Negative for rash.  Neurological: Negative for  dizziness.    Allergies  Aleve and Penicillins  Home Medications   No current outpatient prescriptions on file.  BP 93/58  Pulse 54  Temp 97.8 F (36.6 C) (Oral)  Resp 18  Ht 5\' 10"  (1.778 m)  Wt 168 lb 8 oz (76.431 kg)  BMI 24.18 kg/m2  SpO2 99%  Physical Exam  Constitutional: He is oriented to person, place, and time. He appears well-developed and well-nourished. No distress.  HENT:  Head: Normocephalic and atraumatic.  Mouth/Throat: Oropharynx is clear and moist. No oropharyngeal exudate.  Eyes: Conjunctivae are normal. Pupils are equal, round, and reactive to light.  Neck: Normal range of motion. Neck supple.  Cardiovascular: Normal rate and normal heart sounds.   No murmur heard.      Irregular rhythm  Pulmonary/Chest: Effort normal. No respiratory distress.  Abdominal: Soft. There is no tenderness. There is no rebound and no guarding.  Musculoskeletal: Normal range of motion. He exhibits no edema and no tenderness.  Neurological: He is alert and oriented to person, place, and time. No cranial nerve deficit.  Skin: Skin is warm.    ED Course  Procedures (including critical care time)  Labs Reviewed  CBC - Abnormal; Notable for the following:    WBC 12.7 (*)     Hemoglobin 17.4 (*)     All other components within normal limits  DIFFERENTIAL - Abnormal; Notable for the following:    Neutro Abs 9.0 (*)     All other components within normal limits  BASIC METABOLIC PANEL - Abnormal;  Notable for the following:    GFR calc non Af Amer 67 (*)     GFR calc Af Amer 77 (*)     All other components within normal limits  BASIC METABOLIC PANEL - Abnormal; Notable for the following:    GFR calc non Af Amer 63 (*)     GFR calc Af Amer 73 (*)     All other components within normal limits  CBC - Abnormal; Notable for the following:    WBC 11.1 (*)     All other components within normal limits  CARDIAC PANEL(CRET KIN+CKTOT+MB+TROPI)  PROTIME-INR  HEPARIN LEVEL  (UNFRACTIONATED)  HEPARIN LEVEL (UNFRACTIONATED)   No results found.   1. Atrial flutter with rapid ventricular response       MDM  Atrial flutter with rapid ventricular response. Mental status and blood pressure stable. No distress. Nonobstructive disease on LHC 2010.  Normal EF.  Cardizem bolus and drip. Discussed with cardiology.  We'll admit for A. Flutter with RVR. Continue cardizem and heparin.   Date: 04/10/2012  Rate: 149  Rhythm: atrial flutter  QRS Axis: normal  Intervals: QT prolonged  ST/T Wave abnormalities: nonspecific ST/T changes  Conduction Disutrbances:none  Narrative Interpretation:   Old EKG Reviewed: none available   CRITICAL CARE Performed by: Glynn Octave   Total critical care time: 30  Critical care time was exclusive of separately billable procedures and treating other patients.  Critical care was necessary to treat or prevent imminent or life-threatening deterioration.  Critical care was time spent personally by me on the following activities: development of treatment plan with patient and/or surrogate as well as nursing, discussions with consultants, evaluation of patient's response to treatment, examination of patient, obtaining history from patient or surrogate, ordering and performing treatments and interventions, ordering and review of laboratory studies, ordering and review of radiographic studies, pulse oximetry and re-evaluation of patient's condition.     Glynn Octave, MD 04/11/12 [phone]

## 2012-04-10 NOTE — ED Notes (Signed)
Patient states he states having increased HR yesterday and was seen at [facility] for same on Thursday and on Thursday by time patient got to ED his HR was in normal range sand he felt fine until yesterday when his HR increased. Patient states he feel fine with exception of being tired all the time. Patient denies chest pain and denies SOB/N/V/F/D. Patient called [facility] and was advised by one of the PA's to come to [facility]. Patient placed on monitor and sats of 98% RA and EKG captured on arrived for room.

## 2012-04-10 NOTE — ED Notes (Signed)
Pt. Stated, I'm having some weakness and dizziness that stated Sat. Am. If I walk any distance I'm SOB.  Pt. Denies any pain.

## 2012-04-10 NOTE — Progress Notes (Signed)
Utilization review completed.  

## 2012-04-10 NOTE — ED Notes (Signed)
[facility] at bedside.  

## 2012-04-10 NOTE — ED Notes (Signed)
Pt. Stated, I've been having my heart racing for the last 2 days. I went to [facility] Thursday and when I got there it was normal.  I went to see my Dr. On Friday and I'm schedule for this Friday for a stress test.  Since Sat, my heart has been racing off and on.

## 2012-04-10 NOTE — Progress Notes (Signed)
PHARMACIST - PHYSICIAN COMMUNICATION DR:   Allyson Sabal  CONCERNING: Patient currently on IV Diltiazem and Simvastatin 20mg  po daily. PTA patient was on  Diltiazem po and Simvastatin 20mg  daily.  Patients on diltiazem and simvastatin >10 mg/day have reported cases of rhabdomyolysis.  [place] P&T committee policy is to assess simvastatin dose and if >10 mg, substitute atorvastatin (Lipitor) 1mg  for each 2mg  simvastatin.    RECOMMENDATION: We have substituted 10mg  Atorvastatin for 20mg  Simvastatin. Consider changing statin to Atorvastatin upon discharge if Diltiazem continued.    DESCRIPTION:  Clinically equivalent dose of Lipitor has been substituted for Zocor per [facility] P&T committee policy.   Noah Delaine, RPh Clinical Pharmacist 04/10/2012 15:26
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
## 2012-04-10 NOTE — ED Notes (Signed)
Dr. From [facility] heart and vascular returned page. Will come see pt as soon as possible.

## 2012-04-10 NOTE — ED Notes (Signed)
[facility] paged

## 2012-04-10 NOTE — Progress Notes (Signed)
Patient's heart rate increased to 150's nonsustained while up to bathroom, back down to 70-80's atrial flutter when in bed.  Wilburt Finlay PA paged and notified and request cardizem drip increased to 7.5 mg.  Drip increased.  Will continue to monitor.  Shawn [name]

## 2012-04-10 NOTE — Progress Notes (Signed)
ANTICOAGULATION CONSULT NOTE - Follow Up Consult  Pharmacy Consult for Heparin Indication: atrial fibrillation  Allergies  Allergen Reactions  . Aleve (Naproxen Sodium) Anaphylaxis and Shortness Of Breath  . Penicillins Hives and Itching    Patient Measurements: Height: 5\' 10"  (177.8 cm) Weight: 168 lb 8 oz (76.431 kg) IBW/kg (Calculated) : 73  Heparin Dosing Weight: 76 kg  Vital Signs: Temp: 97.7 F (36.5 C) (06/17 1310) Temp src: Oral (06/17 1310) BP: 118/78 mmHg (06/17 1310) Pulse Rate: 77  (06/17 1310)  Labs:  Basename 04/10/12 2035 04/10/12 1002 04/10/12 0958  HGB -- -- 17.4*  HCT -- -- 49.2  PLT -- -- 245  APTT -- -- --  LABPROT -- -- 13.5  INR -- -- 1.01  HEPARINUNFRC 0.51 -- --  CREATININE -- -- 1.14  CKTOTAL -- 34 --  CKMB -- 1.2 --  TROPONINI -- <0.30 --    Estimated Creatinine Clearance: 68.5 ml/min (by C-G formula based on Cr of 1.14).  Assessment:   Initial heparin level is therapeutic on 1150 units/hr.  Goal of Therapy:  Heparin level 0.3-0.7 units/ml Monitor platelets by anticoagulation protocol: Yes   Plan:    Continue heparin drip at 1150 units/hr.   Next heparin level and CBC in am.   Will follow-up for possible TEE/DCCV and oral anticoagulant plans.  Dennie Fetters, RPh Pager: [phone] 04/10/2012,10:41 PM

## 2012-04-11 ENCOUNTER — Encounter (HOSPITAL_COMMUNITY)
Admission: EM | Disposition: A | Discharge status: Home / Self Care | Payer: Self-pay | Source: Home / Self Care | Attending: Emergency Medicine

## 2012-04-11 ENCOUNTER — Encounter (HOSPITAL_COMMUNITY): Payer: Self-pay | Admitting: Cardiology

## 2012-04-11 DIAGNOSIS — I959 Hypotension, unspecified: Secondary | ICD-10-CM

## 2012-04-11 DIAGNOSIS — I495 Sick sinus syndrome: Secondary | ICD-10-CM

## 2012-04-11 HISTORY — DX: Hypotension, unspecified: I95.9

## 2012-04-11 HISTORY — DX: Sick sinus syndrome: I49.5

## 2012-04-11 LAB — CBC
MCH: 32.1 pg (ref 26.0–34.0)
MCHC: 34.8 g/dL (ref 30.0–36.0)
Platelets: 198 10*3/uL (ref 150–400)
RBC: 4.58 MIL/uL (ref 4.22–5.81)
RDW: 13.3 % (ref 11.5–15.5)

## 2012-04-11 LAB — BASIC METABOLIC PANEL
BUN: 17 mg/dL (ref 6–23)
Calcium: 8.4 mg/dL (ref 8.4–10.5)
GFR calc Af Amer: 73 mL/min — ABNORMAL LOW (ref >90)
GFR calc non Af Amer: 63 mL/min — ABNORMAL LOW (ref >90)
Potassium: 4.2 mEq/L (ref 3.5–5.1)

## 2012-04-11 SURGERY — ECHOCARDIOGRAM, TRANSESOPHAGEAL
Anesthesia: Moderate Sedation

## 2012-04-11 MED ORDER — ASPIRIN EC 325 MG PO TBEC: 325.0000 mg | DELAYED_RELEASE_TABLET | Freq: Every day | ORAL | Status: DC

## 2012-04-11 MED ORDER — DILTIAZEM HCL ER COATED BEADS 120 MG PO TB24: 120.0000 mg | ORAL_TABLET | Freq: Every day | ORAL | Status: DC

## 2012-04-11 MED ORDER — ASPIRIN 325 MG PO TBEC: 325.0000 mg | DELAYED_RELEASE_TABLET | Freq: Every day | ORAL | Status: DC

## 2012-04-11 MED ORDER — ATORVASTATIN CALCIUM 10 MG PO TABS: 10.0000 mg | ORAL_TABLET | Freq: Every day | ORAL | Status: DC

## 2012-04-11 MED ORDER — SODIUM CHLORIDE 0.9 % IV SOLN
INTRAVENOUS | Status: DC
  Administered 2012-04-11: 11:00:00 via INTRAVENOUS

## 2012-04-11 NOTE — Discharge Summary (Signed)
Shawn Baskett C. Reeder Brisby, MD, FACC Attending Cardiologist [facility]  

## 2012-04-11 NOTE — Progress Notes (Signed)
Patient's blood pressure still running in the 90's systolic. Spencer PA notified. Told to increase NS to 142ml/hr to infuse over night. Will continue to monitor patient, and recheck blood pressure routinely per am vital signs.

## 2012-04-11 NOTE — Progress Notes (Signed)
Pt. Seen and examined. Agree with the NP/PA-C note as written.  He has converted to sinus rhythm spontaneously overnight. He is not interested in coumadin, due to bleeding issues in the past, but may be a candidate for Xarelto or Eliquis. In addition, he may be a candidate for antiarrythmic therapy. He is scheduled for an echo today and a stress test in our office on Friday. I feel that he could be safely discharged today. Will stop heparin. Change to cardizem CD 120 daily. D/C on full dose aspirin - however, I feel that he should be on stronger anticoagulation, pending a discussion with Dr. Alanda Amass.  Chrystie Nose, MD, [facility] Attending Cardiologist [facility]

## 2012-04-11 NOTE — Discharge Instructions (Signed)
Call if you develop increased weakness, or lightheaded, dizziness.  Call if you develop rapid heart rate again.  Heart Healthy diet  Try to decrease and stop tobacco.  Increase Asprin to 325 mg enteric coated.  We changed your Zocor (simvastatin) to Lipitor      Atrial Flutter Atrial flutter is a heart rhythm that can cause the heart to beat very fast (tachycardia). It originates in the upper chambers of the heart (atria). In atrial flutter, the top chambers of the heart (atria) often beat much faster than the bottom chambers of the heart (ventricles). Atrial flutter has a regular "saw toothed" appearance in an EKG readout. An EKG is a test that records the electrical activity of the heart. Atrial flutter can cause the heart to beat up to 150 beats per minute (BPM). Atrial flutter can either be short lived (paroxysmal) or permanent.  CAUSES  Causes of atrial flutter can be many. Some of these include:  Heart related issues:   Heart attack (myocardial infarction).   Heart failure.   Heart valve problems.   Poorly controlled high blood pressure (hypertension).   Afteropen heart surgery.   Lung related issues:   A blood clot in the lungs (pulmonary embolism).   Chronic obstructive pulmonary disease (COPD). Medications used to treat COPD can attribute to atrial flutter.   Other related causes:   Hyperthyroidism.   Caffeine.   Some decongestant cold medications.   Low electrolyte levels such as potassium or magnesium.   Cocaine.  SYMPTOMS  An awareness of your heart beating rapidly (palpitations).   Shortness of breath.   Chest pain.   Low blood pressure (hypotension).   Dizziness or fainting.  DIAGNOSIS  Different tests can be performed to diagnose atrial flutter.   An EKG.   Holter monitor. This is a 24 hour recording of your heart rhythm. You will also be given a diary. Write down all symptoms that you have and what you were doing at the time you  experienced symptoms.   Cardiac event monitor. This small device can be worn for up to 30 days. When you have heart symptoms, you will push a button on the device. This will then record your heart rhythm.   Echocardiogram. This is an imaging test to look at your heart. Your caregiver will look at your heart valves and the ventricles.   Stress Test. This test can help determine if the atrial flutter is related to exercise or if coronary artery disease is present.   Laboratory studies will look at certain blood levels like:   Complete blood count (CBC).   Potassium.   Magnesium.   Thyroid function.  TREATMENT  Treatment of atrial flutter varies. A combination of therapies may be used or sometimes atrial flutter may need only 1 type of treatment.  Lab work: If your blood work, such as your electrolytes (potassium, magnesium) or your thyroid function tests are abnormal, your caregiver will treat them accordingly.  Medication:  There are several different types of medications that can convert your heart to a normal rhythm and prevent atrial flutter from reoccurring.  Nonsurgical procedures: Nonsurgical techniques may be used to control atrial flutter. Some examples include:  Cardioversion. This technique uses either drugs or an electrical shock to restore a normal heart rhythm:   Cardioversion drugs may be given through an intravenous (IV) line to help "reset" the heart rhythm.   In electrical cardioversion, your caregiver shocks your heart with electrical energy. This helps to reset  the heartbeat to a normal rhythm.   Ablation. If atrial flutter is a persistent problem, an ablation may be needed. This procedure is done under mild sedation. High frequency radio-wave energy is used to destroy the area of heart tissue responsible for atrial flutter.  SEEK IMMEDIATE MEDICAL CARE IF:   Dizziness.   Near fainting or fainting.   Shortness of breath.   Chest pain or pressure.   Sudden  nausea or vomiting.   Profuse sweating.  If you have the above symptoms, call your local emergency service immediately! Do not drive yourself to the hospital. MAKE SURE YOU:   Understand these instructions.   Will watch your condition.   Will get help right away if you are not doing well or get worse.  Document Released: 02/27/2009 Document Revised: 09/30/2011 Document Reviewed: 02/27/2009 [facility] Patient Information 2012 [place], [place].

## 2012-04-11 NOTE — Progress Notes (Signed)
  Echocardiogram 2D Echocardiogram has been performed.  Cathie Beams Deneen 04/11/2012, 12:30 PM

## 2012-04-11 NOTE — Progress Notes (Signed)
Patient converted to sinus rhythm @ 2012 per telemetry monitor. EKG obtained @ 2136 showed Sinus bradycardia with heart rate in the mid 50's Patient blood pressure 90's systolic. Spencer PA notified and made aware. Told to discontinue Cardizem drip, and give 500 cc bolus over an hour, and administer Normal Saline @ 100 ml/hr over 5 hours, and continue heparin @ 11.44ml/hr.  Patient asymptomatic of low BP and HR, will continue to monitor patient.

## 2012-04-11 NOTE — Progress Notes (Signed)
Pt smokes 1 1/2 ppd and says he's tried to quit before using everything there is to quit with but no success. When assessed for willingness to quit he is in precontemplation stage. Based on his assessment he is not interested in quitting. Refused education information. He is aware and voices understanding of risk factors.
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
## 2012-04-11 NOTE — Progress Notes (Signed)
Reviewed discharge instructions with patient and wife, they stated their understanding.  Patient discharged via wheelchair.  Colman Cater

## 2012-04-12 LAB — TSH: TSH: 1.251 u[IU]/mL (ref 0.350–4.500)

## 2012-04-13 ENCOUNTER — Encounter (HOSPITAL_COMMUNITY): Payer: Self-pay | Admitting: *Deleted

## 2012-04-13 ENCOUNTER — Emergency Department (HOSPITAL_COMMUNITY)
Admission: EM | Admit: 2012-04-13 | Discharge: 2012-04-13 | Disposition: A | Discharge status: Home / Self Care | Payer: BC Managed Care – PPO | Attending: Internal Medicine | Admitting: Internal Medicine

## 2012-04-13 DIAGNOSIS — E785 Hyperlipidemia, unspecified: Secondary | ICD-10-CM | POA: Insufficient documentation

## 2012-04-13 DIAGNOSIS — Z79899 Other long term (current) drug therapy: Secondary | ICD-10-CM | POA: Insufficient documentation

## 2012-04-13 DIAGNOSIS — F17200 Nicotine dependence, unspecified, uncomplicated: Secondary | ICD-10-CM | POA: Insufficient documentation

## 2012-04-13 DIAGNOSIS — I4892 Unspecified atrial flutter: Secondary | ICD-10-CM | POA: Insufficient documentation

## 2012-04-13 DIAGNOSIS — Z720 Tobacco use: Secondary | ICD-10-CM | POA: Diagnosis present

## 2012-04-13 DIAGNOSIS — I2510 Atherosclerotic heart disease of native coronary artery without angina pectoris: Secondary | ICD-10-CM | POA: Insufficient documentation

## 2012-04-13 DIAGNOSIS — I480 Paroxysmal atrial fibrillation: Secondary | ICD-10-CM | POA: Diagnosis present

## 2012-04-13 DIAGNOSIS — I4891 Unspecified atrial fibrillation: Secondary | ICD-10-CM | POA: Insufficient documentation

## 2012-04-13 DIAGNOSIS — Z9889 Other specified postprocedural states: Secondary | ICD-10-CM

## 2012-04-13 DIAGNOSIS — I495 Sick sinus syndrome: Secondary | ICD-10-CM | POA: Insufficient documentation

## 2012-04-13 LAB — POCT I-STAT TROPONIN I: Troponin i, poc: 0 ng/mL (ref 0.00–0.08)

## 2012-04-13 LAB — POCT I-STAT, CHEM 8
BUN: 13 mg/dL (ref 6–23)
Chloride: 105 mEq/L (ref 96–112)
HCT: 48 % (ref 39.0–52.0)
Sodium: 143 mEq/L (ref 135–145)
TCO2: 24 mmol/L (ref 0–100)

## 2012-04-13 LAB — DIFFERENTIAL
Basophils Absolute: 0.1 10*3/uL (ref 0.0–0.1)
Basophils Relative: 1 % (ref 0–1)
Eosinophils Absolute: 0.8 10*3/uL — ABNORMAL HIGH (ref 0.0–0.7)
Neutro Abs: 5.9 10*3/uL (ref 1.7–7.7)
Neutrophils Relative %: 54 % (ref 43–77)

## 2012-04-13 LAB — BASIC METABOLIC PANEL
Chloride: 105 mEq/L (ref 96–112)
GFR calc Af Amer: 89 mL/min — ABNORMAL LOW (ref >90)
GFR calc non Af Amer: 77 mL/min — ABNORMAL LOW (ref >90)
Potassium: 3.6 mEq/L (ref 3.5–5.1)

## 2012-04-13 LAB — CBC
MCH: 31.8 pg (ref 26.0–34.0)
MCHC: 34.6 g/dL (ref 30.0–36.0)
Platelets: 264 10*3/uL (ref 150–400)
RDW: 13.1 % (ref 11.5–15.5)

## 2012-04-13 MED ORDER — METOPROLOL TARTRATE 1 MG/ML IV SOLN
5.0000 mg | Freq: Once | INTRAVENOUS | Status: AC
  Administered 2012-04-13: 5 mg via INTRAVENOUS

## 2012-04-13 MED ORDER — METOPROLOL TARTRATE 1 MG/ML IV SOLN
INTRAVENOUS | Status: AC
  Administered 2012-04-13: 5 mg via INTRAVENOUS
  Filled 2012-04-13: qty 5

## 2012-04-13 NOTE — ED Notes (Signed)
Pt sent home, denying any pain. A x 4. Cardiology discharging patient to follow up tomorrow for stress test

## 2012-04-13 NOTE — ED Notes (Signed)
Subjective:  Pt just discharged 04/11/12 after admission for PAF. He was in NSR at discharge. He has a tendency to SSS with bradycardia when in NSR. He originally had PAF in 2010. Earlier this month he went to the ER at [facility] for tachycardia that converted after he came to the ER. He saw Dr Alanda Amass in the office 04/07/12 and was in NSR. He was admitted 04/09/12 with recurrent PAF, converted to NSR spontaneously, and was discharge with plans for an exercise Myoview tomorrow. He was instructed not to take his Diltiazem 120 today. Around lunchtime he went into rapid AF. He took a Diltiazem 60mg  without change in his symptoms. In the ER he is in AF/Flutter with variable VR.  Objective:  Vital Signs in the last 24 hours: Temp:  [97.7 F (36.5 C)] 97.7 F (36.5 C) (06/20 1657) Pulse Rate:  [83-150] 83  (06/20 1728) Resp:  [16-18] 18  (06/20 1728) BP: (97-119)/(64-86) 119/86 mmHg (06/20 1728) SpO2:  [100 %] 100 % (06/20 1728)  Intake/Output from previous day: No intake or output data in the 24 hours ending 04/13/12 1738  Physical Exam: General appearance: alert, cooperative and no distress Lungs: clear to auscultation bilaterally Heart: regular rate and rhythm   Rate: 80-140  Rhythm: atrial fibrillation and atrial flutter  Lab Results:  Basename 04/11/12 0455  WBC 11.1*  HGB 14.7  PLT 198    Basename 04/11/12 0455  NA 142  K 4.2  CL 108  CO2 26  GLUCOSE 94  BUN 17  CREATININE 1.20   No results found for this basename: TROPONINI:2,CK,MB:2 in the last 72 hours Hepatic Function Panel No results found for this basename: PROT,ALBUMIN,AST,ALT,ALKPHOS,BILITOT,BILIDIR,IBILI in the last 72 hours No results found for this basename: CHOL in the last 72 hours No results found for this basename: INR in the last 72 hours  Imaging: Imaging results have been reviewed  Cardiac Studies:  Assessment/Plan:   Active Problems:  Paroxysmal atrial fibrillation  CAD (coronary artery disease) Non  critical. Last cath 08/2009, 20-30% LM, 60% diag, 50% LAD mid, 60-70% LCX, EF 50%  Tobacco abuse  S/P carotid endarterectomy: left, 04/2009  SSS (sick sinus syndrome)  Dyslipidemia  Plan- IV Lopressor X 1. Possibly home later with plans to change his Myoview to [place]. He is not on Coumadin because of some past negative experience.    Corine Shelter PA-C 04/13/2012, 5:38 PM

## 2012-04-13 NOTE — ED Notes (Signed)
Cardiology Dr. Rennis Golden reporting pt being discharged home. Will follow up tomorrow in his office.

## 2012-04-13 NOTE — ED Notes (Signed)
Pt states that around lunch time he started having the fluttering in his chest. Pt suppose to having stress test tomorrow and was taking off his cardiezm for the stress test. (which was doubled to 120mg  after his last hospital vist.) pt states that he took one of his 60mg  of cardiezm to see if it would help his heart rate after lunch. When it did not he then came to E.D. Pt not symptomatic, no CP just feels the fluttering in his chest.

## 2012-04-13 NOTE — ED Notes (Signed)
Pt. Seen and examined. Agree with the NP/PA-C note as written.  Well known patient to me. Just discharged from the ED 2 days ago. Scheduled for NST tomorrow. He was in a-flutter and converted and was sent home. He developed a-flutter again with RVR and was holding cardizem for his stress in the morning.  HR is now improved with IV lopressor. I would recommend sending him home and asked him to take his cardizem CD 120 mg tonight. He will have a lexiscan NST in the office tomorrow. Follow-up in our office. He wants to remain on full dose aspirin for now, but will need to be started on a warfarin alternative, per his preference.  Chrystie Nose, MD, [facility] Attending Cardiologist [facility]

## 2012-04-14 ENCOUNTER — Inpatient Hospital Stay (HOSPITAL_COMMUNITY)
Admission: AD | Admit: 2012-04-14 | Discharge: 2012-04-16 | DRG: 139 | Disposition: A | Discharge status: Home / Self Care | Payer: BC Managed Care – PPO | Source: Ambulatory Visit | Attending: Internal Medicine | Admitting: Internal Medicine

## 2012-04-14 ENCOUNTER — Encounter (HOSPITAL_COMMUNITY): Payer: Self-pay | Admitting: Cardiology

## 2012-04-14 DIAGNOSIS — I495 Sick sinus syndrome: Secondary | ICD-10-CM | POA: Diagnosis present

## 2012-04-14 DIAGNOSIS — I4891 Unspecified atrial fibrillation: Principal | ICD-10-CM | POA: Diagnosis present

## 2012-04-14 DIAGNOSIS — F17200 Nicotine dependence, unspecified, uncomplicated: Secondary | ICD-10-CM | POA: Diagnosis present

## 2012-04-14 DIAGNOSIS — E785 Hyperlipidemia, unspecified: Secondary | ICD-10-CM | POA: Diagnosis present

## 2012-04-14 DIAGNOSIS — I480 Paroxysmal atrial fibrillation: Secondary | ICD-10-CM | POA: Diagnosis present

## 2012-04-14 DIAGNOSIS — I4892 Unspecified atrial flutter: Secondary | ICD-10-CM | POA: Diagnosis present

## 2012-04-14 DIAGNOSIS — Z9889 Other specified postprocedural states: Secondary | ICD-10-CM

## 2012-04-14 DIAGNOSIS — E7800 Pure hypercholesterolemia, unspecified: Secondary | ICD-10-CM | POA: Diagnosis present

## 2012-04-14 DIAGNOSIS — Z720 Tobacco use: Secondary | ICD-10-CM | POA: Diagnosis present

## 2012-04-14 DIAGNOSIS — I2510 Atherosclerotic heart disease of native coronary artery without angina pectoris: Secondary | ICD-10-CM | POA: Diagnosis present

## 2012-04-14 DIAGNOSIS — Z7982 Long term (current) use of aspirin: Secondary | ICD-10-CM

## 2012-04-14 DIAGNOSIS — I959 Hypotension, unspecified: Secondary | ICD-10-CM | POA: Diagnosis present

## 2012-04-14 LAB — BASIC METABOLIC PANEL
Chloride: 103 mEq/L (ref 96–112)
GFR calc non Af Amer: 64 mL/min — ABNORMAL LOW (ref >90)
Glucose, Bld: 206 mg/dL — ABNORMAL HIGH (ref 70–99)
Potassium: 3.8 mEq/L (ref 3.5–5.1)
Sodium: 140 mEq/L (ref 135–145)

## 2012-04-14 LAB — CARDIAC PANEL(CRET KIN+CKTOT+MB+TROPI)
CK, MB: 1.5 ng/mL (ref 0.3–4.0)
Relative Index: INVALID (ref 0.0–2.5)
Relative Index: INVALID (ref 0.0–2.5)
Total CK: 29 U/L (ref 7–232)

## 2012-04-14 LAB — PROTIME-INR: INR: 1.03 (ref 0.00–1.49)

## 2012-04-14 MED ORDER — SODIUM CHLORIDE 0.9 % IV BOLUS (SEPSIS)
500.0000 mL | Freq: Once | INTRAVENOUS | Status: AC
  Administered 2012-04-14: 500 mL via INTRAVENOUS

## 2012-04-14 MED ORDER — SODIUM CHLORIDE 0.45 % IV BOLUS
250.0000 mL | Freq: Once | INTRAVENOUS | Status: AC
  Administered 2012-04-14: 250 mL via INTRAVENOUS

## 2012-04-14 MED ORDER — AMIODARONE HCL IN DEXTROSE 360-4.14 MG/200ML-% IV SOLN
60.0000 mg/h | INTRAVENOUS | Status: AC
  Administered 2012-04-14 (×2): 60 mg/h via INTRAVENOUS
  Filled 2012-04-14 (×3): qty 200

## 2012-04-14 MED ORDER — HEPARIN BOLUS VIA INFUSION
4000.0000 [IU] | Freq: Once | INTRAVENOUS | Status: AC
Start: 2012-04-14 — End: 2012-04-14
  Administered 2012-04-14: 4000 [IU] via INTRAVENOUS
  Filled 2012-04-14: qty 4000

## 2012-04-14 MED ORDER — AMIODARONE LOAD VIA INFUSION
150.0000 mg | Freq: Once | INTRAVENOUS | Status: AC
  Administered 2012-04-14: 150 mg via INTRAVENOUS
  Filled 2012-04-14: qty 83.34

## 2012-04-14 MED ORDER — ALPRAZOLAM 0.25 MG PO TABS: 0.2500 mg | ORAL_TABLET | Freq: Two times a day (BID) | ORAL | Status: DC | PRN

## 2012-04-14 MED ORDER — ZOLPIDEM TARTRATE 5 MG PO TABS: 10.0000 mg | ORAL_TABLET | Freq: Every evening | ORAL | Status: DC | PRN

## 2012-04-14 MED ORDER — HEPARIN (PORCINE) IN NACL 100-0.45 UNIT/ML-% IJ SOLN
1150.0000 [IU]/h | INTRAMUSCULAR | Status: DC
  Administered 2012-04-14 – 2012-04-16 (×3): 1150 [IU]/h via INTRAVENOUS
  Filled 2012-04-14 (×4): qty 250

## 2012-04-14 MED ORDER — NITROGLYCERIN 0.4 MG SL SUBL: 0.4000 mg | SUBLINGUAL_TABLET | SUBLINGUAL | Status: DC | PRN

## 2012-04-14 MED ORDER — AMIODARONE HCL IN DEXTROSE 360-4.14 MG/200ML-% IV SOLN
30.0000 mg/h | INTRAVENOUS | Status: DC
  Filled 2012-04-14 (×3): qty 200

## 2012-04-14 MED ORDER — SODIUM CHLORIDE 0.45 % IV SOLN
INTRAVENOUS | Status: DC
  Administered 2012-04-14: 75 mL/h via INTRAVENOUS
  Administered 2012-04-15 – 2012-04-16 (×2): via INTRAVENOUS

## 2012-04-14 MED ORDER — DILTIAZEM HCL 30 MG PO TABS
30.0000 mg | ORAL_TABLET | Freq: Four times a day (QID) | ORAL | Status: DC
  Filled 2012-04-14 (×8): qty 1

## 2012-04-14 MED ORDER — ACETAMINOPHEN 325 MG PO TABS: 650.0000 mg | ORAL_TABLET | ORAL | Status: DC | PRN

## 2012-04-14 MED ORDER — ASPIRIN EC 81 MG PO TBEC
81.0000 mg | DELAYED_RELEASE_TABLET | Freq: Every day | ORAL | Status: DC
  Administered 2012-04-15 – 2012-04-16 (×2): 81 mg via ORAL
  Filled 2012-04-14 (×2): qty 1

## 2012-04-14 MED ORDER — ONDANSETRON HCL 4 MG/2ML IJ SOLN: 4.0000 mg | Freq: Four times a day (QID) | INTRAMUSCULAR | Status: DC | PRN

## 2012-04-14 NOTE — H&P (Signed)
Pt. Seen and examined. Agree with the NP/PA-C note as written.  Recent A-fib with RVR then typical atrial flutter .. reoccurance with 2:1 AV block. Now 3:1. He was sent home yesterday and felt well then HR jumped to 150 at home. He was then sent from the office before completing his nuclear sterss test (only rest images were obtained). At this point, I feel he needs anti-arryhthmic therapy. He has not complained of chest pain, but does have a history of moderate to severe CAD. Given that history, the safest medication to use would be amiodarone. We'll initiate this IV as well as heparin.  If he converts to sinus again, I would switch him to po amiodarone after the IV load tomorrow and he may be able to go home. If he does not convert, he will need a TEE cardioversion. We could schedule him to come back as an outpatient for that on Tuesday with me.  Chrystie Nose, MD, [facility] Attending Cardiologist [facility]

## 2012-04-14 NOTE — Progress Notes (Signed)
ANTICOAGULATION CONSULT NOTE - Initial Consult  Pharmacy Consult for Heparin Indication: atrial fibrillation  Allergies  Allergen Reactions  . Aleve (Naproxen Sodium) Anaphylaxis and Shortness Of Breath  . Penicillins Hives and Itching    Patient Measurements: Height: 5\' 10"  (177.8 cm) Weight: 168 lb 8 oz (76.431 kg) IBW/kg (Calculated) : 73  Heparin Dosing Weight: 76 kg  Vital Signs: Temp: 97.5 F (36.4 C) (06/21 1315) Temp src: Oral (06/21 1315) BP: 107/74 mmHg (06/21 1315) Pulse Rate: 80  (06/21 1315)  Labs:  Basename 04/13/12 1754 04/13/12 1720  HGB 16.3 16.2  HCT 48.0 46.8  PLT -- 264  APTT -- --  LABPROT -- --  INR -- --  HEPARINUNFRC -- --  CREATININE 1.10 1.01  CKTOTAL -- --  CKMB -- --  TROPONINI -- --    Estimated Creatinine Clearance: 71 ml/min (by C-G formula based on Cr of 1.1).   Medical History: Past Medical History  Diagnosis Date  . A-fib   . High cholesterol   . Atrial fibrillation   . Coronary artery disease   . Pneumonia     hx of   . Shortness of breath   . Hypotension 04/11/2012  . SSS (sick sinus syndrome) 04/11/2012    Assessment: Patient is a 89 YOM with history of afib but not on anticoagulation prior to admission, who presented with multiple recent episodes of A. Flutter. Pt. was scheduled for stress test this morning but canceled d/t hypotension and A. flutter with RVR. Plan for start heparin infusion for now, and pending decision for ischemic workup. Baseline INR 1.01 on 6/17. Hgb 16.2, Plt 164    Goal of Therapy:  Heparin level 0.3-0.7 units/ml Monitor platelets by anticoagulation protocol: Yes   Plan:  - Start heparin bolus 4000 units x 1, then 1150 units/hr - Check heparin level 6 hrs post infusion start (at 2100) - f/u daily cbc and heparin level, and further plans for anticoagulation.   Bayard Hugger, PharmD, BCPS  Clinical Pharmacist  Pager: [phone]  04/14/2012,2:24 PM

## 2012-04-14 NOTE — Progress Notes (Signed)
Patient converted to SR today at 1624. Pt was in aflutter and was put on amioderone at 1545. Will continue to monitor. Md is aware.   Kearstyn Avitia, Charlaine Dalton RN

## 2012-04-14 NOTE — Progress Notes (Signed)
ANTICOAGULATION CONSULT NOTE - Initial Consult  Pharmacy Consult for Heparin Indication: atrial fibrillation  Allergies  Allergen Reactions  . Aleve (Naproxen Sodium) Anaphylaxis and Shortness Of Breath  . Penicillins Hives and Itching    Patient Measurements: Height: 5\' 10"  (177.8 cm) Weight: 168 lb 8 oz (76.431 kg) IBW/kg (Calculated) : 73  Heparin Dosing Weight: 76 kg  Vital Signs: Temp: 97.6 F (36.4 C) (06/21 2000) Temp src: Oral (06/21 2000) BP: 82/47 mmHg (06/21 2000) Pulse Rate: 56  (06/21 2000)  Labs:  Basename 04/14/12 1954 04/14/12 1400 04/13/12 1754 04/13/12 1720  HGB -- -- 16.3 16.2  HCT -- -- 48.0 46.8  PLT -- -- -- 264  APTT -- -- -- --  LABPROT -- 13.7 -- --  INR -- 1.03 -- --  HEPARINUNFRC 0.67 -- -- --  CREATININE -- 1.18 1.10 1.01  CKTOTAL 27 29 -- --  CKMB 1.3 1.5 -- --  TROPONINI <0.30 <0.30 -- --    Estimated Creatinine Clearance: 66.2 ml/min (by C-G formula based on Cr of 1.18).   Medical History: Past Medical History  Diagnosis Date  . A-fib   . High cholesterol   . Atrial fibrillation   . Coronary artery disease   . Pneumonia     hx of   . Shortness of breath   . Hypotension 04/11/2012  . SSS (sick sinus syndrome) 04/11/2012    Assessment: Patient is a 68 YOM with history of afib but not on anticoagulation prior to admission, who presented with multiple recent episodes of A. Flutter. Pt. was scheduled for stress test this morning but canceled d/t hypotension and A. flutter with RVR. Heparin therapeutic again.    Goal of Therapy:  Heparin level 0.3-0.7 units/ml Monitor platelets by anticoagulation protocol: Yes   Plan:  - Cont heparin drip at 1150 units/hr - F/u with level in AM 04/14/2012,9:13 PM

## 2012-04-14 NOTE — H&P (Signed)
Shawn [name] is an 66 y.o. male.   Cardioligist:  Dr. Alanda Amass Chief Complaint: weak, SOB A flutter with RVR HPI: Pt with recurrent A. Flutter, initially in 2010 and then none until 04/06/12, then RVR went to ER and converted with cardizem and his 60 mg. Daily cadizem was increased to 60 BID.  Then 04/10/12 admitted to [facility] with A. Fib with RVR converted after cardizem drip was d/c'd secondary to hypotension.  IV fluids given and pt. Maintained SR-SB. He continued to be hypotensive and cardizem was held.  He did take on Wed. Without problems.  Thursday he held cardizem due to Stress test, then he went back into a fib with RVR, took 60 mg of cardizem at our instructions.  Came to the er where he converted to SR.  Was d/c'd to take nuc study today and while in waiting room developed A. Flutter with RVR.  Was lightheaded and dizzy.  BP wa low in the 90's systolic.  Dr. Clarene Duke in the office cancelled the nuc. Study and had pt. Admitted to stepdown.  He is still in A. Flutter with RVR most of time up to HR of 160 and at other times 80.    Will begin IV heparin, IV amiodarone since he has failed cardizem.  He has difficulty with rate lowering meds due to hypotension and bradycardia and was only on 60 of po cardizem since 2010.Marland Kitchen  He does have CAD and underwent left heart catheterization back in November 2010 at which time he was found to have noncritical disease with a 20-30% left main, diagonal 60%, 50% mid LAD, 60-70% circumflex, normal dominant right ejection fraction of 50%. Patient underwent left carotid endarterectomy by [name] in July 2010.  He continues to smoke.  He was on coumadin at one point but bled very easily so it was stopped.  He is amenable to other anticoagulation.    Past Medical History  Diagnosis Date  . A-fib   . High cholesterol   . Atrial fibrillation   . Coronary artery disease   . Pneumonia     hx of   . Shortness of breath   . Hypotension 04/11/2012  . SSS (sick  sinus syndrome) 04/11/2012    Past Surgical History  Procedure Date  . Carotid endarterectomy     No family history on file. Social History:  reports that he has been smoking Cigarettes.  He has a 100 pack-year smoking history. He has never used smokeless tobacco. He reports that he does not drink alcohol or use illicit drugs. Married, works as a Biochemist, clinical.  Allergies:  Allergies  Allergen Reactions  . Aleve (Naproxen Sodium) Anaphylaxis and Shortness Of Breath  . Penicillins Hives and Itching    Medications Prior to Admission  Medication Sig Dispense Refill  . aspirin EC 325 MG EC tablet Take 1 tablet (325 mg total) by mouth daily.  30 tablet    . atorvastatin (LIPITOR) 10 MG tablet Take 1 tablet (10 mg total) by mouth daily at 6 PM.  30 tablet  11  . Cyanocobalamin (VITAMIN B-12 PO) Take 1 tablet by mouth daily.      Marland Kitchen diltiazem (CARDIZEM LA) 120 MG 24 hr tablet Take 1 tablet (120 mg total) by mouth daily.  30 tablet  11  . phenylephrine (NEO-SYNEPHRINE) 1 % nasal spray Place 1 drop into the nose 3 (three) times daily.      . Vitamins-Lipotropics (LIPO-FLAVONOID PLUS PO) Take 2 tablets by mouth 2 (  two) times daily.         Results for orders placed during the hospital encounter of 04/13/12 (from the past 48 hour(s))  CBC     Status: Abnormal   Collection Time   04/13/12  5:20 PM      Component Value Range Comment   WBC 10.9 (*) 4.0 - 10.5 K/uL    RBC 5.10  4.22 - 5.81 MIL/uL    Hemoglobin 16.2  13.0 - 17.0 g/dL    HCT 19.1  47.8 - 29.5 %    MCV 91.8  78.0 - 100.0 fL    MCH 31.8  26.0 - 34.0 pg    MCHC 34.6  30.0 - 36.0 g/dL    RDW 62.1  30.8 - 65.7 %    Platelets 264  150 - 400 K/uL   DIFFERENTIAL     Status: Abnormal   Collection Time   04/13/12  5:20 PM      Component Value Range Comment   Neutrophils Relative 54  43 - 77 %    Neutro Abs 5.9  1.7 - 7.7 K/uL    Lymphocytes Relative 29  12 - 46 %    Lymphs Abs 3.2  0.7 - 4.0 K/uL    Monocytes Relative 9  3 - 12 %     Monocytes Absolute 1.0  0.1 - 1.0 K/uL    Eosinophils Relative 7 (*) 0 - 5 %    Eosinophils Absolute 0.8 (*) 0.0 - 0.7 K/uL    Basophils Relative 1  0 - 1 %    Basophils Absolute 0.1  0.0 - 0.1 K/uL   BASIC METABOLIC PANEL     Status: Abnormal   Collection Time   04/13/12  5:20 PM      Component Value Range Comment   Sodium 142  135 - 145 mEq/L    Potassium 3.6  3.5 - 5.1 mEq/L    Chloride 105  96 - 112 mEq/L    CO2 25  19 - 32 mEq/L    Glucose, Bld 83  70 - 99 mg/dL    BUN 13  6 - 23 mg/dL    Creatinine, Ser 8.46  0.50 - 1.35 mg/dL    Calcium 9.4  8.4 - 96.2 mg/dL    GFR calc non Af Amer 77 (*) >90 mL/min    GFR calc Af Amer 89 (*) >90 mL/min   POCT I-STAT TROPONIN I     Status: Normal   Collection Time   04/13/12  5:51 PM      Component Value Range Comment   Troponin i, poc 0.00  0.00 - 0.08 ng/mL    Comment 3            POCT I-STAT, CHEM 8     Status: Normal   Collection Time   04/13/12  5:54 PM      Component Value Range Comment   Sodium 143  135 - 145 mEq/L    Potassium 3.8  3.5 - 5.1 mEq/L    Chloride 105  96 - 112 mEq/L    BUN 13  6 - 23 mg/dL    Creatinine, Ser 9.52  0.50 - 1.35 mg/dL    Glucose, Bld 87  70 - 99 mg/dL    Calcium, Ion 8.41  3.24 - 1.32 mmol/L    TCO2 24  0 - 100 mmol/L    Hemoglobin 16.3  13.0 - 17.0 g/dL    HCT 40.1  02.7 -  52.0 %    No results found.  Previous TSH 1.25  ROS: General:no colds or fevers.  Skin:no rashes HEENT:no blurred vision AO:ZHYQMV heart rate,  PUL:no Sob GI:no diarrhea, constipation or melena GU:no hematuria MS:no joint pain Neuro:no syncope Endo:no diabetes, no thyroid disease.   Blood pressure 107/74, pulse 80, temperature 97.5 F (36.4 C), temperature source Oral, resp. rate 21, height 5\' 10"  (1.778 m), weight 76.431 kg (168 lb 8 oz), SpO2 99.00%. PE: General:while in bed no lightheadedness or dizziness, pleasant affect, but pt is very frustrated and is concerned something is causing the A. Fib/flutter Skin:warm  and dry brisk capillary refill HEENT:normocephalic, sclera clear Neck:supple, no JVD, no carotid bruits Heart:S1S2 RRR no obvious murmur, no gallup Lungs:clear without rales, rhonchi or wheezes Abd:+ BS, soft, non tender Ext:no edema. Neuro:alert and oriented X 3, MAE follows commands.    Assessment/Plan Patient Active Problem List  Diagnosis  . Tobacco abuse  . Paroxysmal atrial fibrillation  . CAD (coronary artery disease) Non critical. Last cath 08/2009, 20-30% LM, 60% diag, 50% LAD mid, 60-70% LCX, EF 50%  . S/P carotid endarterectomy: left, 04/2009  . Hypotension  . SSS (sick sinus syndrome)  . Dyslipidemia   PLAN:4th episode of a. Flutter with RVR, symptomatic with weakness and dizziness.  Will add Heparin for now and Amiodarone in addition to cardizem, once his BP is improved, giving Bolus 0.5 NS.   Would still need ischemic workup.  Previous cardiac enzymes have been negative.  Will plan for lexiscan myoview  Tomorrow. TSH has been WNL.  Jamisen Duerson R 04/14/2012, 2:20 PM   &P

## 2012-04-15 ENCOUNTER — Inpatient Hospital Stay (HOSPITAL_COMMUNITY): Payer: BC Managed Care – PPO

## 2012-04-15 DIAGNOSIS — I4891 Unspecified atrial fibrillation: Principal | ICD-10-CM

## 2012-04-15 LAB — CBC
MCH: 31.7 pg (ref 26.0–34.0)
MCHC: 34.3 g/dL (ref 30.0–36.0)
Platelets: 191 10*3/uL (ref 150–400)
RDW: 13.4 % (ref 11.5–15.5)

## 2012-04-15 LAB — CARDIAC PANEL(CRET KIN+CKTOT+MB+TROPI)
Relative Index: INVALID (ref 0.0–2.5)
Total CK: 21 U/L (ref 7–232)
Troponin I: <0.3 ng/mL (ref <0.30)

## 2012-04-15 LAB — BASIC METABOLIC PANEL
BUN: 15 mg/dL (ref 6–23)
Creatinine, Ser: 1.11 mg/dL (ref 0.50–1.35)
GFR calc Af Amer: 80 mL/min — ABNORMAL LOW (ref >90)
GFR calc non Af Amer: 69 mL/min — ABNORMAL LOW (ref >90)

## 2012-04-15 MED ORDER — TECHNETIUM TC 99M TETROFOSMIN IV KIT
30.0000 | PACK | Freq: Once | INTRAVENOUS | Status: AC | PRN
  Administered 2012-04-15: 30 via INTRAVENOUS

## 2012-04-15 MED ORDER — AMIODARONE HCL 200 MG PO TABS
400.0000 mg | ORAL_TABLET | Freq: Two times a day (BID) | ORAL | Status: DC
  Administered 2012-04-15 – 2012-04-16 (×2): 400 mg via ORAL
  Filled 2012-04-15 (×3): qty 2

## 2012-04-15 MED ORDER — TECHNETIUM TC 99M TETROFOSMIN IV KIT
10.0000 | PACK | Freq: Once | INTRAVENOUS | Status: AC | PRN
  Administered 2012-04-15: 10 via INTRAVENOUS

## 2012-04-15 MED ORDER — REGADENOSON 0.4 MG/5ML IV SOLN
0.4000 mg | Freq: Once | INTRAVENOUS | Status: AC
  Administered 2012-04-15: 0.4 mg via INTRAVENOUS
  Filled 2012-04-15: qty 5

## 2012-04-15 NOTE — Consult Note (Signed)
Reason for Consult:Recurrent atrial fib and flutter  Referring Physician: Levin Dagostino Shimabukuro is an 66 y.o. male.   HPI: The patient is a 66 yo man with a h/o non-obstructive CAD, who developed atrial fib/flutter approx 3 yrs ago. He initially did well on low dose cardizem. He subsequently developed recurrent palpitations several days ago. He has been back and forth to the hospital 3 times.  He was started on amiodarone and has returned to NSR. The patient underwent stress testing today which demonstrates preserved LV function and no ischemia. When he goes into atrial fib and flutter (he has had both) he feels palpitations and sob. No chest pain. He has had bradycardia into the 50's with a 2.5 second pause. (post termination).   PMH: Past Medical History  Diagnosis Date  . A-fib   . High cholesterol   . Atrial fibrillation   . Coronary artery disease   . Pneumonia     hx of   . Shortness of breath   . Hypotension 04/11/2012  . SSS (sick sinus syndrome) 04/11/2012    PSHX: Past Surgical History  Procedure Date  . Carotid endarterectomy   . Cardiac catheterization     FAMHX:History reviewed. No pertinent family history.  Social History:  reports that he has been smoking Cigarettes.  He has a 100 pack-year smoking history. He has never used smokeless tobacco. He reports that he does not drink alcohol or use illicit drugs.  Allergies:  Allergies  Allergen Reactions  . Aleve (Naproxen Sodium) Anaphylaxis and Shortness Of Breath  . Penicillins Hives and Itching    Medications: reviewed  Nm Myocar Multi W/spect W/wall Motion / Ef  04/15/2012  *RADIOLOGY REPORT*  Clinical Data:  Short of breath.  Coronary artery disease.  MYOCARDIAL IMAGING WITH SPECT (REST AND PHARMACOLOGIC-STRESS) GATED LEFT VENTRICULAR WALL MOTION STUDY LEFT VENTRICULAR EJECTION FRACTION  Technique:  Standard myocardial SPECT imaging was performed after resting intravenous injection of 10 mCi Tc-20m  tetrofosmin. Subsequently, intravenous infusion of Lexiscan was performed under the supervision of the Cardiology staff.  At peak effect of the drug, 30 mCi Tc-27m tetrofosmin was injected intravenously and standard myocardial SPECT  imaging was performed.  Quantitative gated imaging was also performed to evaluate left ventricular wall motion, and estimate left ventricular ejection fraction.  Comparison:  None.  Findings:  Spect:  Anteroseptal attenuation.  Extending of the inferolateral wall.  No stress induced ischemia.  Wall motion:  Normal motion  Ejection fraction:  70%.  End diastolic volume 97 ml.  End-systolic volume 29 ml.  IMPRESSION: No stress induced ischemia.  Original Report Authenticated By: Donavan Burnet, M.D.    ROS  As stated in the HPI and negative for all other systems.  Physical Exam  Vitals:Blood pressure 112/72, pulse 43, temperature 97.5 F (36.4 C), temperature source Oral, resp. rate 12, height 5\' 10"  (1.778 m), weight 168 lb 8 oz (76.431 kg), SpO2 100.00%.  Well appearing middle aged man, NAD HEENT: Unremarkable Neck:  No JVD, no thyromegally Lungs:  Clear with no wheezes HEART:  Regular rate rhythm, no murmurs, no rubs, no clicks Abd:  Flat, positive bowel sounds, no organomegally, no rebound, no guarding Ext:  2 plus pulses, no edema, no cyanosis, no clubbing Skin:  No rashes no nodules Neuro:  CN II through XII intact, motor grossly intact  Assessment/Plan: 1. Parox atrial fib and flutter 2. Sinus brady 3. Non-obst. CAD with a negative stress test Rec: In the short term,  I agree with amiodarone to control his arrhythmias. He would be a candidate for catheter ablation but if anti-arrhythmic drugs will hold him then this would be reasonable. My long term plan would be to switch him to Dronenderone after 3-4 months. I would keep in the hospital for 24-36 hours and discharge on 400 mg twice daily for 5 days then 400 mg daily. Would not be too concerned with  nocturnal bradycardia. After 3-4 weeks, would decrease amio to 200 mg daily.  Sharlot Gowda TaylorMD 04/15/2012, 4:03 PM

## 2012-04-15 NOTE — Progress Notes (Signed)
Completed Lexiscan myoview, pt with SOB that resolved with recovery.  Resume diet.

## 2012-04-15 NOTE — Progress Notes (Signed)
Subjective: Converted to SB around 1630 yesterday.  Last pm hypotension fluid bolus given and Amiodarone d/c'd BP as low as 75/45  Fluids continued at 75 cc /hr.  Pauses.   Objective: Vital signs in last 24 hours: Temp:  [97.3 F (36.3 C)-97.8 F (36.6 C)] 97.5 F (36.4 C) (06/22 0751) Pulse Rate:  [47-149] 50  (06/22 0700) Resp:  [12-25] 17  (06/22 0700) BP: (72-118)/(43-74) 100/62 mmHg (06/22 0700) SpO2:  [97 %-100 %] 99 % (06/22 0700) Weight:  [76.431 kg (168 lb 8 oz)] 76.431 kg (168 lb 8 oz) (06/21 1300) Weight change:  Last BM Date: 04/14/12 Intake/Output from previous day:+1334 06/21 0701 - 06/22 0700 In: 1620.9 [I.V.:1620.9] Out: 200 [Urine:200] Intake/Output this shift: Total I/O In: -  Out: 400 [Urine:400]  PE: General:no complaints Heart:S1S2 RRR bradyscardic  Lungs:clear Abd:+ BS soft non tender Ext:no edema      Lab Results:  Basename 04/15/12 0610 04/13/12 1754 04/13/12 1720  WBC 10.5 -- 10.9*  HGB 13.5 16.3 --  HCT 39.4 48.0 --  PLT 191 -- 264   BMET  Basename 04/15/12 0610 04/14/12 1400  NA 141 140  K 4.3 3.8  CL 106 103  CO2 27 29  GLUCOSE 90 206*  BUN 15 18  CREATININE 1.11 1.18  CALCIUM 8.6 9.3    Basename 04/15/12 0211 04/14/12 1954  TROPONINI <0.30 <0.30    Lab Results  Component Value Date   CHOL  Value: 141        ATP III CLASSIFICATION:  <200     mg/dL   Desirable  811-914  mg/dL   Borderline High  >=782    mg/dL   High        95/03/2129   HDL 31* 09/02/2009   LDLCALC  Value: 97        Total Cholesterol/HDL:CHD Risk Coronary Heart Disease Risk Table                     Men   Women  1/2 Average Risk   3.4   3.3  Average Risk       5.0   4.4  2 X Average Risk   9.6   7.1  3 X Average Risk  23.4   11.0        Use the calculated Patient Ratio above and the CHD Risk Table to determine the patient's CHD Risk.        ATP III CLASSIFICATION (LDL):  <100     mg/dL   Optimal  865-784  mg/dL   Near or Above                    Optimal  130-159   mg/dL   Borderline  696-295  mg/dL   High  >284     mg/dL   Very High 13/11/4399   TRIG 63 09/02/2009   CHOLHDL 4.5 09/02/2009   Lab Results  Component Value Date   HGBA1C  Value: 5.4 (NOTE) The ADA recommends the following therapeutic goal for glycemic control related to Hgb A1c measurement: Goal of therapy: <6.5 Hgb A1c  Reference: American Diabetes Association: Clinical Practice Recommendations 2010, Diabetes Care, 2010, 33: (Suppl  1). 09/01/2009     Lab Results  Component Value Date   TSH 1.251 04/11/2012    Hepatic Function Panel No results found for this basename: PROT,ALBUMIN,AST,ALT,ALKPHOS,BILITOT,BILIDIR,IBILI in the last 72 hours No results found for this basename: CHOL in the last 72  hours No results found for this basename: PROTIME in the last 72 hours    EKG: Orders placed during the hospital encounter of 04/14/12  . EKG 12-LEAD  . EKG 12-LEAD  . EKG 12-LEAD  . EKG 12-LEAD  . EKG 12-LEAD    Studies/Results: No results found.  Medications: I have reviewed the patient's current medications.    Marland Kitchen amiodarone  150 mg Intravenous Once  . aspirin EC  81 mg Oral Daily  . diltiazem  30 mg Oral Q6H  . heparin  4,000 Units Intravenous Once  . sodium chloride  250 mL Intravenous Once  . sodium chloride  500 mL Intravenous Once   Assessment/Plan: Patient Active Problem List  Diagnosis  . Tobacco abuse  . Paroxysmal atrial fibrillation, with RVR  . CAD (coronary artery disease) Non critical. Last cath 08/2009, 20-30% LM, 60% diag, 50% LAD mid, 60-70% LCX, EF 50%  . S/P carotid endarterectomy: left, 04/2009  . Hypotension  . SSS (sick sinus syndrome)  . Dyslipidemia   PLAN: nuc study today.  Bradycardic to 44, Will ask EP to see for tachybrady.  HR down to 40's with amiodarone and hypotensive,  Did not received cardizem due to hypotension.  Currently not receiving amio ? Hold until EP opinion.  On IV Heparin.  LOS: 1 day   Elgar Scoggins R 04/15/2012, 7:55 AM

## 2012-04-15 NOTE — Progress Notes (Signed)
Pt. Seen and examined. Agree with the NP/PA-C note as written.  History of sick sinus syndrome with tachy-brady events overnight. He has had recurrent a-fib with RVR as well as typical isthmus-dependent flutter with 2:1 and 3:1 block. In addition, he had several short pauses (one with conversion) and bradycardia with HR's in the 40's-50's and associated symptomatic hypotension (dizziness, lightheadedness).  I think at this point a pacemaker is indicated for further management. Will need to complete NST today (he had rest images in our office on Friday) to exclude reversible ischemia. Will then ask [facility] EP later today for their opinion regarding management.  Continue heparin for now and would consider Xarelto or Eliquis on discharge.  Chrystie Nose, MD, [facility] Attending Cardiologist [facility]

## 2012-04-15 NOTE — Progress Notes (Signed)
ANTICOAGULATION CONSULT NOTE - Follow-up Consult  Pharmacy Consult for Heparin Indication: atrial fibrillation  Allergies  Allergen Reactions  . Aleve (Naproxen Sodium) Anaphylaxis and Shortness Of Breath  . Penicillins Hives and Itching    Patient Measurements: Height: 5\' 10"  (177.8 cm) Weight: 168 lb 8 oz (76.431 kg) IBW/kg (Calculated) : 73  Heparin Dosing Weight: 76 kg  Vital Signs: Temp: 97.5 F (36.4 C) (06/22 0751) Temp src: Oral (06/22 0751) BP: 100/62 mmHg (06/22 0700) Pulse Rate: 50  (06/22 0700)  Labs:  Basename 04/15/12 0610 04/15/12 0211 04/14/12 1954 04/14/12 1400 04/13/12 1754 04/13/12 1720  HGB 13.5 -- -- -- 16.3 --  HCT 39.4 -- -- -- 48.0 46.8  PLT 191 -- -- -- -- 264  APTT -- -- -- -- -- --  LABPROT -- -- -- 13.7 -- --  INR -- -- -- 1.03 -- --  HEPARINUNFRC 0.65 -- 0.67 -- -- --  CREATININE 1.11 -- -- 1.18 1.10 --  CKTOTAL -- 21 27 29  -- --  CKMB -- 1.3 1.3 1.5 -- --  TROPONINI -- <0.30 <0.30 <0.30 -- --    Estimated Creatinine Clearance: 70.3 ml/min (by C-G formula based on Cr of 1.11).   Medical History: Past Medical History  Diagnosis Date  . A-fib   . High cholesterol   . Atrial fibrillation   . Coronary artery disease   . Pneumonia     hx of   . Shortness of breath   . Hypotension 04/11/2012  . SSS (sick sinus syndrome) 04/11/2012    Assessment: Patient is a 60 YOM with history of afib but not on anticoagulation prior to admission, who presented with multiple recent episodes of A. Flutter.  Patient continued on diltiazem and aspirin, and IV amiodarone and heparin started.    Heparin level currently therapeutic. Hgb 16.3-->13.5, plts 264-->191. No bleeding noted per RN. Will continue heparin at current rate and f/u long-term anticoagulation plans   Goal of Therapy:  Heparin level 0.3-0.7 units/ml Monitor platelets by anticoagulation protocol: Yes   Plan:  - Cont heparin drip at 1150 units/hr - F/u daily CBC and heparin levels.     Haynes Hoehn, PharmD 04/15/2012 8:16 AM  Pager: [phone]

## 2012-04-16 LAB — CBC
MCHC: 34.3 g/dL (ref 30.0–36.0)
RDW: 13.1 % (ref 11.5–15.5)

## 2012-04-16 LAB — HEPARIN LEVEL (UNFRACTIONATED): Heparin Unfractionated: 0.62 IU/mL (ref 0.30–0.70)

## 2012-04-16 MED ORDER — RIVAROXABAN 10 MG PO TABS
20.0000 mg | ORAL_TABLET | Freq: Every day | ORAL | Status: DC
  Administered 2012-04-16: 20 mg via ORAL
  Filled 2012-04-16: qty 2

## 2012-04-16 MED ORDER — RIVAROXABAN 20 MG PO TABS: 20.0000 mg | ORAL_TABLET | Freq: Every day | ORAL | Status: DC

## 2012-04-16 MED ORDER — AMIODARONE HCL 200 MG PO TABS: 400.0000 mg | ORAL_TABLET | Freq: Two times a day (BID) | ORAL | Status: DC

## 2012-04-16 MED ORDER — ASPIRIN 81 MG PO TBEC: 81.0000 mg | DELAYED_RELEASE_TABLET | Freq: Every day | ORAL | Status: AC

## 2012-04-16 MED ORDER — NITROGLYCERIN 0.4 MG SL SUBL: 0.4000 mg | SUBLINGUAL_TABLET | SUBLINGUAL | Status: DC | PRN

## 2012-04-16 MED ORDER — DOCUSATE SODIUM 100 MG PO CAPS
200.0000 mg | ORAL_CAPSULE | Freq: Every day | ORAL | Status: DC
  Administered 2012-04-16: 200 mg via ORAL
  Filled 2012-04-16: qty 2

## 2012-04-16 NOTE — Discharge Summary (Signed)
Physician Discharge Summary  Patient ID: Shawn [name] MRN: [id number] DOB/AGE: 1946/03/09 66 y.o.  Admit date: 04/14/2012 Discharge date: 04/16/2012  Discharge Diagnoses:  Principal Problem:  *Paroxysmal atrial fibrillation, with RVR, resolved with Amiodarone, d/c'd with SR-SB  Active Problems:  Hypotension  SSS (sick sinus syndrome)  Tobacco abuse  CAD (coronary artery disease) Non critical. Last cath 08/2009, 20-30% LM, 60% diag, 50% LAD mid, 60-70% LCX, EF 50%  S/P carotid endarterectomy: left, 04/2009  Dyslipidemia   Discharged Condition: good  Hospital Course: 66 year old male with recurrent A. Flutter, initially in 2010 and then none until 04/06/12, A. Fib  RVR went to ER and converted with cardizem and his 60 mg. Daily cadizem was increased to 60 BID. Then 04/10/12 admitted to [facility] with A. Fib with RVR converted after cardizem drip which was d/c'd secondary to hypotension. IV fluids given and pt. Maintained SR-SB. He continued to be hypotensive and cardizem was held. He did take on Wed. Without problems. Thursday he held cardizem due to Stress test, then he went back into a fib with RVR, took 60 mg of cardizem at our instructions. Came to the er where he converted to SR. Was d/c'd to take nuc study today and while in waiting room developed A. Flutter with RVR. Was lightheaded and dizzy. BP wa low in the 90's systolic, this despite having 528 mg of cardizem CD the night before.  Dr. Clarene Duke in the office cancelled the nuc. Study and had pt. Admitted to stepdown. He was still in A. Flutter with RVR on arrival, most of time up to HR of 160 and at other times 80.  IV heparin, IV amiodarone, since he has failed cardizem, were started. He has difficulty with rate lowering meds due to hypotension and bradycardia and was only on 60 of short acting Cardizem cardizem since 2010.Marland Kitchen   He does have CAD and underwent left heart catheterization back in November 2010 at which time he was found to have  noncritical disease with a 20-30% left main, diagonal 60%, 50% mid LAD, 60-70% circumflex, normal dominant right ejection fraction of 50%. Patient underwent left carotid endarterectomy by [name] in July 2010. He continues to smoke. He was on coumadin at one point but bled very easily so it was stopped. He is amenable to other anticoagulation.   After his IV Amiodarone bolus and drip at 1 mg per minute for 6 hours patient's blood pressure dropped into the 80s and into the 70s. His amiodarone was DC'd he was given IV fluid bolus with improvement in the blood pressure. Patient was in bed and was not lightheaded or dizzy.  After the amiodarone bolus the patient did convert to sinus rhythm-Sinus bradycardia. He had a 2.5 second pause with conversion from a fib to sinus rhythm.   He has maintained sinus rhythm sinus bradycardia throughout the rest of the hospitalization.  Due to episodic bradycardia with heart rates in the 40s while awake and even lower in the low 40s while asleep EP consult was obtained for sick sinus syndrome.  For now plan is to continue the amiodarone at 400 mg twice a day for 5 days then 400 mg daily for 2-3 weeks and then 200 mg daily. At some point in the future patient will be changed to Dronendrone.  Dr. Ladona Ridgel negative he would be a candidate for catheter ablation but if antiarrhythmic drugs would hold him and that a line would be reasonable.  Patient did undergo Scientist, physiological  during this hospitalization which was negative for ischemia.  By 04/16/2012 patient was stable ready for discharge, He was stable and ambulating without any complications. Heart rate at sleep 43 beats per minute heart rate at rest the lowest was 47 beats per minute but with activity it would go up to 60.    He'll followup as instructed. Additionally if he were to go back into A. Fib have asked him to take an extra 60 mg of Cardizem which we did not discharge him with that he has some at home on to see if  that would help break the arrhythmia or hope is that he has no further atrial fib or flutter.  He was also discharged with Xarelto for anticoagulation.   Consults: cardiology---EP Consult with Dr. Sharrell Ku  Significant Diagnostic Studies: At discharge sodium 141 potassium 4.3 chloride 106 CO2 27 BUN 15 creatinine 1.11 calcium 8.6 glucose 90.  Cardiac enzymes were negative x3 with troponin< 0.30.  Hemoglobin 13.7 hematocrit 39.9 WBC 9.5 and platelets 194.   TSH was 1.251  Nuclear stress test i.e. Steffanie Dunn was neg for ischemia and EF 70%.  Previous Echo was WNL,  Please see report.  Discharge Exam: Blood pressure 106/65, pulse 43, temperature 97.6 F (36.4 C), temperature source Oral, resp. rate 18, height 5\' 10"  (1.778 m), weight 76.431 kg (168 lb 8 oz), SpO2 99.00%.   General:alert, oriented MAE  Heart:S1S2 RRR  Lungs:clear  Abd:+ BS, soft, nontender  Ext:no edema  Disposition: 01-Home or Self Care  Discharge Orders    Future Appointments: Provider: Department: Dept Phone: Center:   12/15/2012 3:00 PM [facility] [phone] [facility]   12/15/2012 4:00 PM Evern Bio, NP [facility] [phone] [facility]     Medication List  As of 04/16/2012  2:04 PM   STOP taking these medications         diltiazem 120 MG 24 hr tablet         TAKE these medications         amiodarone 200 MG tablet   Commonly known as: PACERONE   Take 2 tablets (400 mg total) by mouth 2 (two) times daily.      aspirin 81 MG EC tablet   Take 1 tablet (81 mg total) by mouth daily.      atorvastatin 10 MG tablet   Commonly known as: LIPITOR   Take 1 tablet (10 mg total) by mouth daily at 6 PM.      LIPO-FLAVONOID PLUS PO   Take 2 tablets by mouth 2 (two) times daily.      nitroGLYCERIN 0.4 MG SL tablet   Commonly known as: NITROSTAT   Place 1 tablet (0.4 mg total) under the tongue every 5 (five) minutes x 3 doses as needed for chest pain.      phenylephrine 1 % nasal  spray   Commonly known as: NEO-SYNEPHRINE   Place 1 drop into the nose 3 (three) times daily.      Rivaroxaban 20 MG Tabs   Take 20 mg by mouth daily with supper.      VITAMIN B-12 PO   Take 1 tablet by mouth daily.           Follow-up Information    Follow up with Chrystie Nose, MD. (in [place] in 2 weeks, the office will call with date and time)    Contact information:   [address] [place] [address] [phone]  Discharge instructions:  Heart Healthy diet  If your heart begins to race again take one of the 60 mg of cardizem at home to perhaps slow or convert you back to regular rhythm. Call if any questions or problems.  On the Amiodarone, different generic names,  Take 2 of 200 mg. Tablets twice a day through 04/19/12. Then on 04/20/12 begin 2 tablets once a day, ok to take both at the same time.  Then Dr. Rennis Golden will direct you to decrease to 200 mg or 1 tablet in 3-4 weeks.   Our office will arrange for you to wear a monitor so we can make sure your heart rate does not become too slow.  SignedLeone Brand 04/16/2012, 2:04 PM

## 2012-04-16 NOTE — Progress Notes (Signed)
ANTICOAGULATION CONSULT NOTE - Follow-up Consult  Pharmacy Consult for Heparin Indication: atrial fibrillation  Allergies  Allergen Reactions  . Aleve (Naproxen Sodium) Anaphylaxis and Shortness Of Breath  . Penicillins Hives and Itching    Patient Measurements: Height: 5\' 10"  (177.8 cm) Weight: 168 lb 8 oz (76.431 kg) IBW/kg (Calculated) : 73  Heparin Dosing Weight: 76 kg  Vital Signs: Temp: 98.1 F (36.7 C) (06/23 0752) Temp src: Oral (06/23 0752) BP: 96/65 mmHg (06/23 0800)  Labs:  Alvira Philips 04/16/12 0545 04/15/12 0610 04/15/12 0211 04/14/12 1954 04/14/12 1400 04/13/12 1754 04/13/12 1720  HGB 13.7 13.5 -- -- -- -- --  HCT 39.9 39.4 -- -- -- 48.0 --  PLT 194 191 -- -- -- -- 264  APTT -- -- -- -- -- -- --  LABPROT -- -- -- -- 13.7 -- --  INR -- -- -- -- 1.03 -- --  HEPARINUNFRC 0.62 0.65 -- 0.67 -- -- --  CREATININE -- 1.11 -- -- 1.18 1.10 --  CKTOTAL -- -- 21 27 29  -- --  CKMB -- -- 1.3 1.3 1.5 -- --  TROPONINI -- -- <0.30 <0.30 <0.30 -- --    Estimated Creatinine Clearance: 70.3 ml/min (by C-G formula based on Cr of 1.11).   Medical History: Past Medical History  Diagnosis Date  . A-fib   . High cholesterol   . Atrial fibrillation   . Coronary artery disease   . Pneumonia     hx of   . Shortness of breath   . Hypotension 04/11/2012  . SSS (sick sinus syndrome) 04/11/2012    Assessment: Patient is a 56 YOM with history of afib but not on anticoagulation prior to admission, who presented with multiple recent episodes of A. Flutter.  Pt currently on aspirin, IV heparin, PO amiodarone, and PO diltiazem.  Bradycardia in NSR.  Nuclear stress test 6/22 negative for ischemia.   Heparin level currently therapeutic. Hgb, plts ok. No bleeding noted. Will continue heparin at current rate and f/u long-term anticoagulation plans   Goal of Therapy:  Heparin level 0.3-0.7 units/ml Monitor platelets by anticoagulation protocol: Yes   Plan:  - Cont heparin drip at  1150 units/hr - F/u daily CBC and heparin levels.   Haynes Hoehn, PharmD 04/16/2012 8:53 AM  Pager: [phone]

## 2012-04-16 NOTE — Progress Notes (Signed)
Subjective: Heart rate in low 40's while sleeping, while awake in the upper 40's to 50's, occ in the 60's.  Objective: Vital signs in last 24 hours: Temp:  [97.5 F (36.4 C)-98.5 F (36.9 C)] 98.1 F (36.7 C) (06/23 0752) Pulse Rate:  [43-89] 43  (06/22 1200) Resp:  [12-24] 16  (06/23 0752) BP: (87-112)/(55-84) 98/62 mmHg (06/23 0330) SpO2:  [97 %-100 %] 97 % (06/23 0752) Weight change:  Last BM Date: 04/14/12 Intake/Output from previous day: +1466 06/22 0701 - 06/23 0700 In: 2829.5 [P.O.:840; I.V.:1989.5] Out: 1450 [Urine:1450] Intake/Output this shift:    PE: General:alert, oriented MAE Heart:S1S2 RRR Lungs:clear Abd:+ BS, soft, nontender Ext:no edema    Lab Results:  Basename 04/16/12 0545 04/15/12 0610  WBC 9.5 10.5  HGB 13.7 13.5  HCT 39.9 39.4  PLT 194 191   BMET  Basename 04/15/12 0610 04/14/12 1400  NA 141 140  K 4.3 3.8  CL 106 103  CO2 27 29  GLUCOSE 90 206*  BUN 15 18  CREATININE 1.11 1.18  CALCIUM 8.6 9.3    Basename 04/15/12 0211 04/14/12 1954  TROPONINI <0.30 <0.30    Lab Results  Component Value Date   CHOL  Value: 141        ATP III CLASSIFICATION:  <200     mg/dL   Desirable  213-086  mg/dL   Borderline High  >=578    mg/dL   High        46/06/6294   HDL 31* 09/02/2009   LDLCALC  Value: 97        Total Cholesterol/HDL:CHD Risk Coronary Heart Disease Risk Table                     Men   Women  1/2 Average Risk   3.4   3.3  Average Risk       5.0   4.4  2 X Average Risk   9.6   7.1  3 X Average Risk  23.4   11.0        Use the calculated Patient Ratio above and the CHD Risk Table to determine the patient's CHD Risk.        ATP III CLASSIFICATION (LDL):  <100     mg/dL   Optimal  284-132  mg/dL   Near or Above                    Optimal  130-159  mg/dL   Borderline  440-102  mg/dL   High  >725     mg/dL   Very High 36/03/4402   TRIG 63 09/02/2009   CHOLHDL 4.5 09/02/2009   Lab Results  Component Value Date   HGBA1C  Value: 5.4 (NOTE) The ADA  recommends the following therapeutic goal for glycemic control related to Hgb A1c measurement: Goal of therapy: <6.5 Hgb A1c  Reference: American Diabetes Association: Clinical Practice Recommendations 2010, Diabetes Care, 2010, 33: (Suppl  1). 09/01/2009     Lab Results  Component Value Date   TSH 1.251 04/11/2012    Hepatic Function Panel No results found for this basename: PROT,ALBUMIN,AST,ALT,ALKPHOS,BILITOT,BILIDIR,IBILI in the last 72 hours No results found for this basename: CHOL in the last 72 hours No results found for this basename: PROTIME in the last 72 hours    EKG: Orders placed during the hospital encounter of 04/14/12  . EKG 12-LEAD  . EKG 12-LEAD  . EKG 12-LEAD  . EKG 12-LEAD  .  EKG 12-LEAD    Studies/Results: Nm Myocar Multi W/spect W/wall Motion / Ef  04/15/2012  *RADIOLOGY REPORT*  Clinical Data:  Short of breath.  Coronary artery disease.  MYOCARDIAL IMAGING WITH SPECT (REST AND PHARMACOLOGIC-STRESS) GATED LEFT VENTRICULAR WALL MOTION STUDY LEFT VENTRICULAR EJECTION FRACTION  Technique:  Standard myocardial SPECT imaging was performed after resting intravenous injection of 10 mCi Tc-37m tetrofosmin. Subsequently, intravenous infusion of Lexiscan was performed under the supervision of the Cardiology staff.  At peak effect of the drug, 30 mCi Tc-69m tetrofosmin was injected intravenously and standard myocardial SPECT  imaging was performed.  Quantitative gated imaging was also performed to evaluate left ventricular wall motion, and estimate left ventricular ejection fraction.  Comparison:  None.  Findings:  Spect:  Anteroseptal attenuation.  Extending of the inferolateral wall.  No stress induced ischemia.  Wall motion:  Normal motion  Ejection fraction:  70%.  End diastolic volume 97 ml.  End-systolic volume 29 ml.  IMPRESSION: No stress induced ischemia.  Original Report Authenticated By: Donavan Burnet, M.D.    Medications: I have reviewed the patient's current  medications.    Marland Kitchen amiodarone  400 mg Oral BID  . aspirin EC  81 mg Oral Daily  . regadenoson  0.4 mg Intravenous Once  . DISCONTD: diltiazem  30 mg Oral Q6H   Assessment/Plan: Patient Active Problem List  Diagnosis  . Tobacco abuse  . Paroxysmal atrial fibrillation, with RVR  . CAD (coronary artery disease) Non critical. Last cath 08/2009, 20-30% LM, 60% diag, 50% LAD mid, 60-70% LCX, EF 50%  . S/P carotid endarterectomy: left, 04/2009  . Hypotension  . SSS (sick sinus syndrome)  . Dyslipidemia   PLAN: Negative MI, Negative Nuc study.  See Dr. Lubertha Basque note.  Have started amio at 400 BID.  Some brady cardia.  IV Heparin continues and fluids at 75 cc/hr.  D/c fluids, ? Change to Xarelto?  Ambulate, ? D/c today vs. Tomorrow.  BP still borderline.    Amiodarone he has rec'd 150 mg bolus, then 360 mg IV and 400 mg po. For total of 910 mg. See Dr. Lubertha Basque recommendations.  ? Out pt monitor for Bradycardia, he has been complaining of fatigue even not in A. Fib.    LOS: 2 days   Shawn [name] R 04/16/2012, 8:20 AM

## 2012-04-16 NOTE — Discharge Instructions (Signed)
Heart Healthy diet  If your heart begins to race again take one of the 60 mg of cardizem at home to perhaps slow or convert you back to regular rhythm. Call if any questions or problems.  On the Amiodarone, different generic names,  Take 2 of 200 mg. Tablets twice a day through 04/19/12. Then on 04/20/12 begin 2 tablets once a day, ok to take both at the same time.  Then Dr. Rennis Golden will direct you to decrease to 200 mg or 1 tablet in 3-4 weeks.   Our office will arrange for you to wear a monitor so we can make sure your heart rate does not become too slow.  Ok to work.

## 2012-04-16 NOTE — Progress Notes (Signed)
Pt. Seen and examined. Agree with the NP/PA-C note as written.  No further a-fib or flutter overnight. Still loading on amiodarone. HR drops into the 40's. Appreciate Dr. Lubertha Basque recommendations. Will continue load with switch to dronedarone down the road. Add xarelto 20 mg daily for stroke prophylaxis. Home with short acting diltiazem for prn use - bradycardia precludes using home cardizem CD dose.  Chrystie Nose, MD, [facility] Attending Cardiologist [facility]

## 2012-04-16 NOTE — Discharge Summary (Signed)
Shawn Shreeve C. Mckyla Deckman, MD, FACC Attending Cardiologist [facility]  

## 2012-04-16 NOTE — Progress Notes (Signed)
Nada Boozer FNP in to see. D/c'd home with wife. D/C instructions given.

## 2012-12-01 ENCOUNTER — Other Ambulatory Visit: Payer: Self-pay | Admitting: *Deleted

## 2012-12-01 DIAGNOSIS — I6529 Occlusion and stenosis of unspecified carotid artery: Secondary | ICD-10-CM

## 2012-12-01 DIAGNOSIS — Z48812 Encounter for surgical aftercare following surgery on the circulatory system: Secondary | ICD-10-CM

## 2012-12-14 ENCOUNTER — Encounter: Payer: Self-pay | Admitting: Neurosurgery

## 2012-12-15 ENCOUNTER — Encounter: Payer: Self-pay | Admitting: Neurosurgery

## 2012-12-15 ENCOUNTER — Other Ambulatory Visit (INDEPENDENT_AMBULATORY_CARE_PROVIDER_SITE_OTHER): Payer: BC Managed Care – PPO | Admitting: Vascular Surgery

## 2012-12-15 ENCOUNTER — Ambulatory Visit (INDEPENDENT_AMBULATORY_CARE_PROVIDER_SITE_OTHER): Payer: BC Managed Care – PPO | Admitting: Neurosurgery

## 2012-12-15 VITALS — BP 123/80 | HR 50 | Resp 16 | Ht 69.0 in | Wt 165.0 lb

## 2012-12-15 DIAGNOSIS — I6529 Occlusion and stenosis of unspecified carotid artery: Secondary | ICD-10-CM

## 2012-12-15 DIAGNOSIS — Z48812 Encounter for surgical aftercare following surgery on the circulatory system: Secondary | ICD-10-CM

## 2012-12-15 NOTE — Progress Notes (Signed)
[facility]  Carotid Office Note  CC: Carotid surveillance Referring Physician: [name]  History of Present Illness: 67 year old male patient of [name] status post left CEA in 2010. The patient denies any signs or symptoms of CVA, TIA, amaurosis fugax or any neural deficit. The patient states he does have chronic A. fib which is controlled with medicine.  Past Medical History  Diagnosis Date  . A-fib   . High cholesterol   . Atrial fibrillation   . Coronary artery disease   . Pneumonia     hx of   . Shortness of breath   . Hypotension 04/11/2012  . SSS (sick sinus syndrome) 04/11/2012    ROS: [x]  Positive   [ ]  Denies    General: [ ]  Weight loss, [ ]  Fever, [ ]  chills Neurologic: [ ]  Dizziness, [ ]  Blackouts, [ ]  Seizure [ ]  Stroke, [ ]  "Mini stroke", [ ]  Slurred speech, [ ]  Temporary blindness; [ ]  weakness in arms or legs, [ ]  Hoarseness Cardiac: [ ]  Chest pain/pressure, [ ]  Shortness of breath at rest [ ]  Shortness of breath with exertion, [ ]  Atrial fibrillation or irregular heartbeat Vascular: [ ]  Pain in legs with walking, [ ]  Pain in legs at rest, [ ]  Pain in legs at night,  [ ]  Non-healing ulcer, [ ]  Blood clot in vein/DVT,   Pulmonary: [ ]  Home oxygen, [ ]  Productive cough, [ ]  Coughing up blood, [ ]  Asthma,  [ ]  Wheezing Musculoskeletal:  [ ]  Arthritis, [ ]  Low back pain, [ ]  Joint pain Hematologic: [ ]  Easy Bruising, [ ]  Anemia; [ ]  Hepatitis Gastrointestinal: [ ]  Blood in stool, [ ]  Gastroesophageal Reflux/heartburn, [ ]  Trouble swallowing Urinary: [ ]  chronic Kidney disease, [ ]  on HD - [ ]  MWF or [ ]  TTHS, [ ]  Burning with urination, [ ]  Difficulty urinating Skin: [ ]  Rashes, [ ]  Wounds Psychological: [ ]  Anxiety, [ ]  Depression   Social History History  Substance Use Topics  . Smoking status: Current Every Day Smoker -- 2.00 packs/day for 50 years    Types: Cigarettes  . Smokeless tobacco: Never Used  . Alcohol Use: No     Family History No family history on file.  Allergies  Allergen Reactions  . Aleve (Naproxen Sodium) Anaphylaxis and Shortness Of Breath  . Penicillins Hives and Itching    Current Outpatient Prescriptions  Medication Sig Dispense Refill  . amiodarone (PACERONE) 200 MG tablet Take 2 tablets (400 mg total) by mouth 2 (two) times daily.  120 tablet  2  . aspirin EC 81 MG EC tablet Take 1 tablet (81 mg total) by mouth daily.      Marland Kitchen atorvastatin (LIPITOR) 10 MG tablet Take 1 tablet (10 mg total) by mouth daily at 6 PM.  30 tablet  11  . Cyanocobalamin (VITAMIN B-12 PO) Take 1 tablet by mouth daily.      . nitroGLYCERIN (NITROSTAT) 0.4 MG SL tablet Place 1 tablet (0.4 mg total) under the tongue every 5 (five) minutes x 3 doses as needed for chest pain.  25 tablet  2  . phenylephrine (NEO-SYNEPHRINE) 1 % nasal spray Place 1 drop into the nose 3 (three) times daily.      . rivaroxaban 20 MG TABS Take 20 mg by mouth daily with supper.  30 tablet  11  . Vitamins-Lipotropics (LIPO-FLAVONOID PLUS PO) Take 2 tablets by mouth 2 (two) times daily.  No current facility-administered medications for this visit.    Physical Examination  Filed Vitals:   12/15/12 1524  BP: 123/80  Pulse: 50  Resp:     Body mass index is 24.36 kg/(m^2).  General:  WDWN in NAD Gait: Normal HEENT: WNL Eyes: Pupils equal Pulmonary: normal non-labored breathing , without Rales, rhonchi,  wheezing Cardiac: RRR, without  Murmurs, rubs or gallops; Abdomen: soft, NT, no masses Skin: no rashes, ulcers noted  Vascular Exam Pulses: 3+ radial pulses bilaterally Carotid bruits: Carotid pulses to auscultation no bruits are heard Extremities without ischemic changes, no Gangrene , no cellulitis; no open wounds;  Musculoskeletal: no muscle wasting or atrophy   Neurologic: A&O X 3; Appropriate Affect ; SENSATION: normal; MOTOR FUNCTION:  moving all extremities equally. Speech is fluent/normal  Non-Invasive  Vascular Imaging CAROTID DUPLEX 12/15/2012  Right ICA 20 - 39 % stenosis Left ICA 0 - 19% stenosis   ASSESSMENT/PLAN: Asymptomatic patient with unchanged exam from previous one year ago. The patient will followup in one year with repeat carotid duplex. The patient's questions were encouraged and answered, he is in agreement with this plan.  Lauree Chandler ANP   Clinic MD: Imogene Burn

## 2012-12-18 ENCOUNTER — Other Ambulatory Visit: Payer: Self-pay | Admitting: *Deleted

## 2012-12-18 DIAGNOSIS — Z48812 Encounter for surgical aftercare following surgery on the circulatory system: Secondary | ICD-10-CM

## 2013-03-12 ENCOUNTER — Other Ambulatory Visit (HOSPITAL_COMMUNITY): Payer: Self-pay | Admitting: Cardiology

## 2013-03-12 NOTE — Telephone Encounter (Signed)
Please review chart and ?refill 

## 2013-04-09 ENCOUNTER — Other Ambulatory Visit (HOSPITAL_COMMUNITY): Payer: Self-pay | Admitting: Cardiology

## 2013-06-20 ENCOUNTER — Other Ambulatory Visit: Payer: Self-pay | Admitting: *Deleted

## 2013-06-20 ENCOUNTER — Telehealth: Payer: Self-pay | Admitting: Internal Medicine

## 2013-06-20 ENCOUNTER — Telehealth: Payer: Self-pay | Admitting: *Deleted

## 2013-06-20 MED ORDER — RIVAROXABAN 20 MG PO TABS: 20.0000 mg | ORAL_TABLET | Freq: Every day | ORAL | Status: DC

## 2013-06-20 NOTE — Telephone Encounter (Signed)
Message forwarded to J. Elkins, RN.  

## 2013-06-20 NOTE — Telephone Encounter (Signed)
PA for Xarelto 20mg  QD faxed to [facility] on 06/20/13

## 2013-06-20 NOTE — Telephone Encounter (Signed)
Returning your call ..Shawn [name]  °

## 2013-06-20 NOTE — Telephone Encounter (Signed)
Need prior authorization to get his Xarelto-Please call -[phone] and their fax number is [phone].

## 2013-06-20 NOTE — Telephone Encounter (Signed)
PA for Xarelto 20mg  tablets (one tab by mouth daily) approved thru 06/20/2014.

## 2013-06-20 NOTE — Telephone Encounter (Signed)
LM for patient to call office to verify insurance info for [employer].

## 2013-06-20 NOTE — Telephone Encounter (Signed)
Please call-need to talk to you about another medicine.

## 2013-06-20 NOTE — Telephone Encounter (Signed)
Rx was sent to pharmacy electronically. 

## 2013-06-21 NOTE — Telephone Encounter (Signed)
Patient had question about switching medication - amiodarone to sotalol after talking with pharmacist and learning of amiodarone side effects. Informed patient that would need appointment with Dr. Rennis Golden in order for medications changes to be made. Patient verbalized understanding and voiced that he may be scheduling an appointment prior to his annual exam in Jan. 2015.

## 2013-08-06 ENCOUNTER — Encounter: Payer: Self-pay | Admitting: *Deleted

## 2013-08-07 ENCOUNTER — Ambulatory Visit (INDEPENDENT_AMBULATORY_CARE_PROVIDER_SITE_OTHER): Payer: Medicare Other | Admitting: Internal Medicine

## 2013-08-07 ENCOUNTER — Encounter: Payer: Self-pay | Admitting: Internal Medicine

## 2013-08-07 VITALS — BP 130/80 | HR 49 | Ht 70.0 in | Wt 171.6 lb

## 2013-08-07 DIAGNOSIS — I480 Paroxysmal atrial fibrillation: Secondary | ICD-10-CM

## 2013-08-07 DIAGNOSIS — I4891 Unspecified atrial fibrillation: Secondary | ICD-10-CM | POA: Diagnosis not present

## 2013-08-07 DIAGNOSIS — E785 Hyperlipidemia, unspecified: Secondary | ICD-10-CM | POA: Diagnosis not present

## 2013-08-07 DIAGNOSIS — I2510 Atherosclerotic heart disease of native coronary artery without angina pectoris: Secondary | ICD-10-CM

## 2013-08-07 MED ORDER — RIVAROXABAN 20 MG PO TABS: 20.0000 mg | ORAL_TABLET | Freq: Every day | ORAL | Status: DC

## 2013-08-07 MED ORDER — DRONEDARONE HCL 400 MG PO TABS: 400.0000 mg | ORAL_TABLET | Freq: Two times a day (BID) | ORAL | Status: DC

## 2013-08-07 MED ORDER — PRAVASTATIN SODIUM 40 MG PO TABS: 40.0000 mg | ORAL_TABLET | Freq: Every evening | ORAL | Status: DC

## 2013-08-07 NOTE — Progress Notes (Signed)
OFFICE NOTE  Chief Complaint:  Routine follow-up  Primary Care Physician: Kirk Ruths, MD  HPI:  Shawn [name] is a pleasant 67 year old gentleman with a history of atrial flutter with rapid ventricular response. He converted to sinus with 2 occurrences. He was seen by EP and recommended amiodarone, which he had been tolerating well. At his last visit, I decreased that. He has also been on Xarelto with some nose bleeding; however, that has improved. He had been using Afrin and I have prescribed him for a Nasonex spray, which he alternated to try to get him off of the aspirin, which most likely was causing a vasomotor rhinitis. He has had no further episodes of epistaxis. He is doing fairly well on amiodarone; however, today we discussed the possible long-term toxicity of this medicine and how it would be more ideal for him to not be on it, given his younger age. I commended switching medications to Multaq.  He did get his medication filled however did not start taking the medication for some reason. He has she has a 90 day supply. Now his insurance is changed to Medicare he is concerned about the cost of it. He continues to take Xarelto which has been paid for. He is no longer having any nosebleeds. In addition he's concerned about side effects with Lipitor which she thinks causes some joint pain or muscle aches.  PMHx:  Past Medical History  Diagnosis Date  . Hyperlipidemia   . Atrial fibrillation     atrial flutter  . Coronary artery disease   . Pneumonia     hx of   . Shortness of breath   . Hypotension 04/11/2012  . SSS (sick sinus syndrome) 04/11/2012  . PAD (peripheral artery disease)   . History of nuclear stress test 08/2009    bruce myoview; normal pattern of perfusion; low risk     Past Surgical History  Procedure Laterality Date  . Carotid endarterectomy Left 04/2009    [name]  . Cardiac catheterization  08/2009    mod 2 vessel disease (L main 20-30%  distal taper, LAD 50% stenosis in prox protion, diagonal branch 60% segmental stenosis, Cfx hsa 60-70% eccentric stenosis in AV groove Cfx)   . Transthoracic echocardiogram  03/2012    EF 55-60%, normal LV systolic function; mild MR; LA mod dilated    FAMHx:  No family history on file.  SOCHx:   reports that he has been smoking Cigarettes.  He has a 100 pack-year smoking history. He has never used smokeless tobacco. He reports that he does not drink alcohol or use illicit drugs.  ALLERGIES:  Allergies  Allergen Reactions  . Aleve [Naproxen Sodium] Anaphylaxis and Shortness Of Breath  . Penicillins Hives and Itching    ROS: A comprehensive review of systems was negative except for: Cardiovascular: positive for irregular heart beat Musculoskeletal: positive for stiff joints  HOME MEDS: Current Outpatient Prescriptions  Medication Sig Dispense Refill  . aspirin 81 MG tablet Take 81 mg by mouth daily.      . Cyanocobalamin (VITAMIN B-12 PO) Take 1 tablet by mouth daily.      . nitroGLYCERIN (NITROSTAT) 0.4 MG SL tablet Place 0.4 mg under the tongue every 5 (five) minutes as needed for chest pain.      . phenylephrine (NEO-SYNEPHRINE) 1 % nasal spray Place 1 drop into the nose 3 (three) times daily.      . Rivaroxaban (XARELTO) 20 MG TABS tablet Take 1  tablet (20 mg total) by mouth daily.  25 tablet  0  . dronedarone (MULTAQ) 400 MG tablet Take 1 tablet (400 mg total) by mouth 2 (two) times daily with a meal.  180 tablet  3  . pravastatin (PRAVACHOL) 40 MG tablet Take 1 tablet (40 mg total) by mouth every evening.  90 tablet  3   No current facility-administered medications for this visit.    LABS/IMAGING: No results found for this or any previous visit (from the past 48 hour(s)). No results found.  VITALS: BP 130/80  Pulse 49  Ht 5\' 10"  (1.778 m)  Wt 171 lb 9.6 oz (77.837 kg)  BMI 24.62 kg/m2  EXAM: General appearance: alert and no distress Neck: no carotid bruit and no  JVD Lungs: clear to auscultation bilaterally Heart: regular rate and rhythm, S1, S2 normal, no murmur, click, rub or gallop Abdomen: soft, non-tender; bowel sounds normal; no masses,  no organomegaly Extremities: extremities normal, atraumatic, no cyanosis or edema Pulses: 2+ and symmetric Skin: Skin color, texture, turgor normal. No rashes or lesions or tanned Neurologic: Alert and oriented X 3, normal strength and tone. Normal symmetric reflexes. Normal coordination and gait Psych: Pleasant, normal  EKG: Sinus bradycardia at 49, QTC 415 ms  ASSESSMENT: 1. Paroxysmal atrial fibrillation on amiodarone 2. Chronic anticoagulation on Xarelto - CHADS2VASC score of 2 3. Dyslipidemia-intolerance to atorvastatin 4. History of carotid artery disease status post left carotid endarterectomy 5. CAD with moderate disease, non-obstructive  PLAN: 1.   Shawn [name] is doing well and this had no recurrence of his atrial fibrillation. As I was previously concerned about using amiodarone long-term, I think that there is concern for side effects. He is having some bluish discoloration of the skin which could be associated with his amiodarone use. He is due for liver, thyroid and pulmonary function testing if we are to continue this medication.  He does have a history of coronary artery disease with mild to moderate stenoses in the past, therefore his antiarrhythmic options are limited to amiodarone, dronedarone or tikosyn.  I believe dronedarone is the the safest option is less possible toxicity, he has marked sinus bradycardia and dronedarone is the least likely to affect that - I've asked him to start dronedarone 5 days after stopping amiodarone.  With regards to his intolerance of atorvastatin, we will have him discontinue that for 2 weeks and then start a low-dose pravastatin. He should continue Xarelto.  I will plan to see him back in 6 months or sooner as necessary.   Chrystie Nose, MD, [facility] Attending  Cardiologist [facility]  Shawn [name] 08/07/2013, 4:34 PM

## 2013-08-07 NOTE — Patient Instructions (Addendum)
Follow-up in 6 months. Have you cholesterol drawn 2 weeks before the visit.  Stop taking amiodarone for 5 days before starting dronaderone. (for a-fib).  Stop atorvastatin for 2 weeks. Starting taking pravastatin (for cholesterol)  Continue xarelto.  Get bloodwork at the lab 1-2 weeks prior to next appointment in 6 months.  Nothing to eat after midnight.

## 2013-08-09 ENCOUNTER — Telehealth: Payer: Self-pay | Admitting: Internal Medicine

## 2013-08-09 MED ORDER — AMIODARONE HCL 200 MG PO TABS: 200.0000 mg | ORAL_TABLET | Freq: Every day | ORAL | Status: DC

## 2013-08-09 NOTE — Telephone Encounter (Signed)
Pt called back and informed per instructions by MD.  Pt verbalized understanding and agreed w/ plan.  Pt repeated instructions.

## 2013-08-09 NOTE — Telephone Encounter (Signed)
Ok.  Stop xarelto and Multaq. Continue amiodarone and aspirin.  Please notify him of this.  -Dr. Rennis Golden

## 2013-08-09 NOTE — Telephone Encounter (Signed)
Please call asap-concerning the new prescriptions that he was given.

## 2013-08-09 NOTE — Telephone Encounter (Signed)
Returned call.  Pt stated he received a quote for the new medication (Multaq) and it will cost him $6000/year.  Also stated Xarelto will cost $300/mo.  Stated he has Medicare and isn't in the doughnut hole yet, but cannot afford these medications.  Stated Dr. Rennis Golden told him that he was borderline stopping the Xarelto and switching to ASA when he saw him and pt wants to know if he can go ahead and stop Xarelto and start ASA.  Pt also wants to know if he can just go back on the amiodarone.  Stated he knows it may cause problems w/ his kidneys, liver or lungs, "but it just may have to" b/c he cannot afford Multaq.  Pt stated he does have a 3-mo supply of Multaq that he's never taken when RN offered samples of both Xarelto and Multaq.  Pt informed Dr. Rennis Golden will be notified for further instructions.  Pt verbalized understanding and agreed w/ plan.  Message forwarded to Dr. Rennis Golden.

## 2013-08-09 NOTE — Telephone Encounter (Signed)
^^  Correction.Marland Kitchen Returned call.  Left message to call back tomorrow before 4pm.

## 2013-08-09 NOTE — Telephone Encounter (Signed)
Returned call.  Left message to call back before 4pm.  

## 2013-10-11 ENCOUNTER — Other Ambulatory Visit: Payer: Self-pay | Admitting: *Deleted

## 2013-10-11 MED ORDER — AMIODARONE HCL 200 MG PO TABS: 200.0000 mg | ORAL_TABLET | Freq: Every day | ORAL | Status: DC

## 2013-10-12 ENCOUNTER — Other Ambulatory Visit: Payer: Self-pay | Admitting: *Deleted

## 2013-10-12 ENCOUNTER — Other Ambulatory Visit (HOSPITAL_COMMUNITY): Payer: Self-pay | Admitting: Internal Medicine

## 2013-10-12 MED ORDER — AMIODARONE HCL 200 MG PO TABS: 200.0000 mg | ORAL_TABLET | Freq: Every day | ORAL | Status: DC

## 2013-10-24 ENCOUNTER — Encounter: Payer: Self-pay | Admitting: Internal Medicine

## 2013-11-22 ENCOUNTER — Telehealth: Payer: Self-pay | Admitting: Internal Medicine

## 2013-11-22 ENCOUNTER — Other Ambulatory Visit: Payer: Self-pay | Admitting: *Deleted

## 2013-11-22 NOTE — Telephone Encounter (Signed)
Returned call.  Line busy x 4.    Alprazolam not listed on pt's current med list.  He will need to contact his PCP for this medication refill.

## 2013-11-22 NOTE — Telephone Encounter (Signed)
Refill for Alprazolam 0.5mg  q4h prn refused - defer to PCP

## 2013-11-22 NOTE — Telephone Encounter (Signed)
Please call-he said his pharmacist said they had not heard back from you concerning his Alprazolam prescription.

## 2013-11-26 NOTE — Telephone Encounter (Signed)
Called patient - spoke with wife. Informed that patient will need to get this medicine from PCP. verbalized understanding.

## 2013-11-26 NOTE — Telephone Encounter (Signed)
Returning your call from Thursday. °

## 2013-12-04 DIAGNOSIS — IMO0002 Reserved for concepts with insufficient information to code with codable children: Secondary | ICD-10-CM | POA: Diagnosis not present

## 2013-12-04 DIAGNOSIS — I10 Essential (primary) hypertension: Secondary | ICD-10-CM | POA: Diagnosis not present

## 2013-12-04 DIAGNOSIS — E785 Hyperlipidemia, unspecified: Secondary | ICD-10-CM | POA: Diagnosis not present

## 2013-12-04 DIAGNOSIS — Z125 Encounter for screening for malignant neoplasm of prostate: Secondary | ICD-10-CM | POA: Diagnosis not present

## 2013-12-04 DIAGNOSIS — F411 Generalized anxiety disorder: Secondary | ICD-10-CM | POA: Diagnosis not present

## 2013-12-12 ENCOUNTER — Encounter: Payer: Self-pay | Admitting: Family

## 2013-12-13 ENCOUNTER — Encounter: Payer: Self-pay | Admitting: Family

## 2013-12-13 ENCOUNTER — Ambulatory Visit (INDEPENDENT_AMBULATORY_CARE_PROVIDER_SITE_OTHER): Payer: Medicare Other | Admitting: Family

## 2013-12-13 ENCOUNTER — Ambulatory Visit (HOSPITAL_COMMUNITY)
Admission: RE | Admit: 2013-12-13 | Discharge: 2013-12-13 | Disposition: A | Discharge status: Home / Self Care | Payer: Medicare Other | Source: Ambulatory Visit | Attending: Family | Admitting: Family

## 2013-12-13 VITALS — BP 117/74 | HR 46 | Resp 14 | Ht 72.0 in | Wt 178.0 lb

## 2013-12-13 DIAGNOSIS — Z9889 Other specified postprocedural states: Secondary | ICD-10-CM | POA: Diagnosis not present

## 2013-12-13 DIAGNOSIS — Z48812 Encounter for surgical aftercare following surgery on the circulatory system: Secondary | ICD-10-CM | POA: Diagnosis not present

## 2013-12-13 DIAGNOSIS — I6529 Occlusion and stenosis of unspecified carotid artery: Secondary | ICD-10-CM

## 2013-12-13 NOTE — Progress Notes (Signed)
Established Carotid Patient   History of Present Illness  Shawn [name] is a 68 y.o. male patient of [name] who is status post left CEA in 2010 for 95%+ stenosis. He returns today for follow up. He had a TIA as manifested by transient expressive aphasia just before the 2010 CEA; he denies hemiparesis,  denies amaurosis fugax, denies facial drooping. He denies claudication symptoms. He does not use ETOH, walks a great deal as part of his job. Patient denies New Medical or Surgical History.  Pt Diabetic: No Pt smoker: smoker  (2 ppd x 50 yrs)  Pt meds include: Statin : Yes ASA: Yes Other anticoagulants/antiplatelets: no, was taking Xaralto due to easy bleeding in skin, stopped by his cardiologist about 6-12 months ago. No atrial flutter since hospitalized for this a year ago.   Past Medical History  Diagnosis Date  . Hyperlipidemia   . Atrial fibrillation     atrial flutter  . Coronary artery disease   . Pneumonia     hx of   . Shortness of breath   . Hypotension 04/11/2012  . SSS (sick sinus syndrome) 04/11/2012  . PAD (peripheral artery disease)   . History of nuclear stress test 08/2009    bruce myoview; normal pattern of perfusion; low risk     Social History History  Substance Use Topics  . Smoking status: Current Every Day Smoker -- 2.00 packs/day for 50 years    Types: Cigarettes  . Smokeless tobacco: Never Used     Comment: smokes 1.5 ppd (08/07/13)  . Alcohol Use: No    Family History No family history on file.  Surgical History Past Surgical History  Procedure Laterality Date  . Carotid endarterectomy Left 04/2009    [name]  . Cardiac catheterization  08/2009    mod 2 vessel disease (L main 20-30% distal taper, LAD 50% stenosis in prox protion, diagonal branch 60% segmental stenosis, Cfx hsa 60-70% eccentric stenosis in AV groove Cfx)   . Transthoracic echocardiogram  03/2012    EF 55-60%, normal LV systolic function; mild MR; LA mod  dilated    Allergies  Allergen Reactions  . Aleve [Naproxen Sodium] Anaphylaxis and Shortness Of Breath  . Penicillins Hives and Itching    Current Outpatient Prescriptions  Medication Sig Dispense Refill  . amiodarone (PACERONE) 200 MG tablet Take 1 tablet (200 mg total) by mouth daily.  90 tablet  2  . aspirin 81 MG tablet Take 81 mg by mouth daily.      . Cyanocobalamin (VITAMIN B-12 PO) Take 1 tablet by mouth daily.      . nitroGLYCERIN (NITROSTAT) 0.4 MG SL tablet Place 0.4 mg under the tongue every 5 (five) minutes as needed for chest pain.      . pravastatin (PRAVACHOL) 40 MG tablet Take 1 tablet (40 mg total) by mouth every evening.  90 tablet  3  . ALPRAZolam (XANAX) 0.5 MG tablet       . betamethasone valerate (VALISONE) 0.1 % cream       . phenylephrine (NEO-SYNEPHRINE) 1 % nasal spray Place 1 drop into the nose 3 (three) times daily.      Marland Kitchen ZOSTAVAX 16109 UNT/0.65ML injection        No current facility-administered medications for this visit.    Review of Systems : See HPI for pertinent positives and negatives.  Physical Examination  Filed Vitals:   12/13/13 1450  BP: 117/74  Pulse: 46  Resp: 14  Filed Weights   12/13/13 1450  Weight: 178 lb (80.74 kg)   Body mass index is 24.14 kg/(m^2).  General: WDWN male in NAD GAIT: normal Eyes: PERRLA Pulmonary:  Non-labored, CTAB, diminished air movement in all fields, Negative  Rales, Negative rhonchi, & Negative wheezing.  Cardiac: sinus bradycardia Rhythm ,  Negative detected murmur.  VASCULAR EXAM Carotid Bruits Left Right   Negative Negative   Radial pulses are 2+ palpable and  equal.                                                                                                                            LE Pulses LEFT RIGHT       POPLITEAL  not palpable   not palpable       POSTERIOR TIBIAL   palpable    palpable     Gastrointestinal: soft, nontender, BS WNL, no r/g,  negative  masses.  Musculoskeletal: Negative muscle atrophy/wasting. M/S 5/5 throughout, Extremities without ischemic changes.  Neurologic: A&O X 3; Appropriate Affect ; SENSATION ;normal;  Speech is normal CN 2-12 intact  except, Pain and light touch intact in extremities, Motor exam as listed above.   Non-Invasive Vascular Imaging CAROTID DUPLEX 12/13/2013   CEREBROVASCULAR DUPLEX EVALUATION    INDICATION: Carotid stenosis    PREVIOUS INTERVENTION(S): Left carotid endarterectomy 05/21/2009    DUPLEX EXAM:     RIGHT  LEFT  Peak Systolic Velocities (cm/s) End Diastolic Velocities (cm/s) Plaque LOCATION Peak Systolic Velocities (cm/s) End Diastolic Velocities (cm/s) Plaque  84 21  CCA PROXIMAL 100 28   81 25  CCA MID 86 24   83 26  CCA DISTAL 103 29   109 19  ECA 95 14   81 32 HT ICA PROXIMAL 60 21   95 38  ICA MID 99 33   101 41  ICA DISTAL 73 23     1.0 ICA / CCA Ratio (PSV) carotid endarterectomy   Antegrade Vertebral Flow Antegrade  120 Brachial Systolic Pressure (mmHg) 118  Triphasic Brachial Artery Waveforms Triphasic    Plaque Morphology:  HM = Homogeneous, HT = Heterogeneous, CP = Calcific Plaque, SP = Smooth Plaque, IP = Irregular Plaque     ADDITIONAL FINDINGS:     IMPRESSION: Right internal carotid artery stenosis present of less than 40%. Left internal carotid artery is patent with history of carotid endarterectomy, mild hyperplasia at the proximal patch without hemodynamic changes present.    Compared to the previous exam:  Unchanged since previous study on 12/15/2012.       Assessment: Shawn [name] is a 68 y.o. male who is status post left CEA in 2010, and presents with asymptomatic minimal right ICA stenosis and patent left ICA which is the CEA site, mild hyperplasia at the proximal patch without hemodynamic changes present. He had a TIA just before the left CEA in 2010. The  ICA stenosis is  Unchanged from previous exam. Unfortunately he continues  to  smoke, he was counseled re this.  Plan: Follow-up in 1 year with Carotid Duplex scan.   I discussed in depth with the patient the nature of atherosclerosis, and emphasized the importance of maximal medical management including strict control of blood pressure, blood glucose, and lipid levels, obtaining regular exercise, and cessation of smoking.  The patient is aware that without maximal medical management the underlying atherosclerotic disease process will progress, limiting the benefit of any interventions. The patient was given information about stroke prevention and what symptoms should prompt the patient to seek immediate medical care. Thank you for allowing us to participate in this patient's care.  Charisse MarchSuzanne Nickel, RN, MSN, FNP-C [facility] Office: [phone]  Clinic Physician: Darrick PennaFields  12/13/2013 2:44 PM

## 2013-12-13 NOTE — Patient Instructions (Addendum)
Stroke Prevention Some health problems and behaviors may make it more likely for you to have a stroke. Below are ways to lessen your risk of having a stroke.   Be active for at least 30 minutes on most or all days.  Do not smoke. Try not to be around others who smoke.  Do not drink too much alcohol.  Do not have more than 2 drinks a day if you are a man.  Do not have more than 1 drink a day if you are a woman and are not pregnant.  Eat healthy foods, such as fruits and vegetables. If you were put on a specific diet, follow the diet as told.  Keep your cholesterol levels under control through diet and medicines. Look for foods that are low in saturated fat, trans fat, cholesterol, and are high in fiber.  If you have diabetes, follow all diet plans and take your medicine as told.  If you have high blood pressure (hypertension), follow all diet plans and take your medicine as told.  Keep a healthy weight. Eat foods that are low in calories, salt, saturated fat, trans fat, and cholesterol.  Do not take drugs.  Avoid birth control pills, if this applies. Talk to your doctor about the risks of taking birth control pills.  Talk to your doctor if you have sleep problems (sleep apnea).  Take all medicine as told by your doctor.  You may be told to take aspirin or blood thinner medicine. Take this medicine as told by your doctor.  Understand your medicine instructions.  Make sure any other conditions you have are being taken care of. GET HELP RIGHT AWAY IF:  You suddenly lose feeling (you feel numb) or have weakness in your face, arm, or leg.  Your face or eyelid hangs down to one side.  You suddenly feel confused.  You have trouble talking (aphasia) or understanding what people are saying.  You suddenly have trouble seeing in one or both eyes.  You suddenly have trouble walking.  You are dizzy.  You lose your balance or your movements are clumsy (uncoordinated).  You  suddenly have a very bad headache and you do not know the cause.  You have new chest pain.  Your heart feels like it is fluttering or skipping a beat (irregular heartbeat). Do not wait to see if the symptoms above go away. Get help right away. Call your local emergency services (911 in [place] Do not drive yourself to the hospital. Document Released: 04/11/2012 Document Revised: 08/01/2013 Document Reviewed: 04/13/2013 [facility] Patient Information 2014 [place], [place].   Smoking Cessation Quitting smoking is important to your health and has many advantages. However, it is not always easy to quit since nicotine is a very addictive drug. Often times, people try 3 times or more before being able to quit. This document explains the best ways for you to prepare to quit smoking. Quitting takes hard work and a lot of effort, but you can do it. ADVANTAGES OF QUITTING SMOKING  You will live longer, feel better, and live better.  Your body will feel the impact of quitting smoking almost immediately.  Within 20 minutes, blood pressure decreases. Your pulse returns to its normal level.  After 8 hours, carbon monoxide levels in the blood return to normal. Your oxygen level increases.  After 24 hours, the chance of having a heart attack starts to decrease. Your breath, hair, and body stop smelling like smoke.  After 48 hours, damaged nerve  endings begin to recover. Your sense of taste and smell improve.  After 72 hours, the body is virtually free of nicotine. Your bronchial tubes relax and breathing becomes easier.  After 2 to 12 weeks, lungs can hold more air. Exercise becomes easier and circulation improves.  The risk of having a heart attack, stroke, cancer, or lung disease is greatly reduced.  After 1 year, the risk of coronary heart disease is cut in half.  After 5 years, the risk of stroke falls to the same as a nonsmoker.  After 10 years, the risk of lung cancer is cut in half and the  risk of other cancers decreases significantly.  After 15 years, the risk of coronary heart disease drops, usually to the level of a nonsmoker.  If you are pregnant, quitting smoking will improve your chances of having a healthy baby.  The people you live with, especially any children, will be healthier.  You will have extra money to spend on things other than cigarettes. QUESTIONS TO THINK ABOUT BEFORE ATTEMPTING TO QUIT You may want to talk about your answers with your caregiver.  Why do you want to quit?  If you tried to quit in the past, what helped and what did not?  What will be the most difficult situations for you after you quit? How will you plan to handle them?  Who can help you through the tough times? Your family? Friends? A caregiver?  What pleasures do you get from smoking? What ways can you still get pleasure if you quit? Here are some questions to ask your caregiver:  How can you help me to be successful at quitting?  What medicine do you think would be best for me and how should I take it?  What should I do if I need more help?  What is smoking withdrawal like? How can I get information on withdrawal? GET READY  Set a quit date.  Change your environment by getting rid of all cigarettes, ashtrays, matches, and lighters in your home, car, or work. Do not let people smoke in your home.  Review your past attempts to quit. Think about what worked and what did not. GET SUPPORT AND ENCOURAGEMENT You have a better chance of being successful if you have help. You can get support in many ways.  Tell your family, friends, and co-workers that you are going to quit and need their support. Ask them not to smoke around you.  Get individual, group, or telephone counseling and support. Programs are available at [employer] and health centers. Call your local health department for information about programs in your area.  Spiritual beliefs and practices may help some  smokers quit.  Download a "quit meter" on your computer to keep track of quit statistics, such as how long you have gone without smoking, cigarettes not smoked, and money saved.  Get a self-help book about quitting smoking and staying off of tobacco. LEARN NEW SKILLS AND BEHAVIORS  Distract yourself from urges to smoke. Talk to someone, go for a walk, or occupy your time with a task.  Change your normal routine. Take a different route to work. Drink tea instead of coffee. Eat breakfast in a different place.  Reduce your stress. Take a hot bath, exercise, or read a book.  Plan something enjoyable to do every day. Reward yourself for not smoking.  Explore interactive web-based programs that specialize in helping you quit. GET MEDICINE AND USE IT CORRECTLY Medicines can help you  stop smoking and decrease the urge to smoke. Combining medicine with the above behavioral methods and support can greatly increase your chances of successfully quitting smoking.  Nicotine replacement therapy helps deliver nicotine to your body without the negative effects and risks of smoking. Nicotine replacement therapy includes nicotine gum, lozenges, inhalers, nasal sprays, and skin patches. Some may be available over-the-counter and others require a prescription.  Antidepressant medicine helps people abstain from smoking, but how this works is unknown. This medicine is available by prescription.  Nicotinic receptor partial agonist medicine simulates the effect of nicotine in your brain. This medicine is available by prescription. Ask your caregiver for advice about which medicines to use and how to use them based on your health history. Your caregiver will tell you what side effects to look out for if you choose to be on a medicine or therapy. Carefully read the information on the package. Do not use any other product containing nicotine while using a nicotine replacement product.  RELAPSE OR DIFFICULT  SITUATIONS Most relapses occur within the first 3 months after quitting. Do not be discouraged if you start smoking again. Remember, most people try several times before finally quitting. You may have symptoms of withdrawal because your body is used to nicotine. You may crave cigarettes, be irritable, feel very hungry, cough often, get headaches, or have difficulty concentrating. The withdrawal symptoms are only temporary. They are strongest when you first quit, but they will go away within 10 14 days. To reduce the chances of relapse, try to:  Avoid drinking alcohol. Drinking lowers your chances of successfully quitting.  Reduce the amount of caffeine you consume. Once you quit smoking, the amount of caffeine in your body increases and can give you symptoms, such as a rapid heartbeat, sweating, and anxiety.  Avoid smokers because they can make you want to smoke.  Do not let weight gain distract you. Many smokers will gain weight when they quit, usually less than 10 pounds. Eat a healthy diet and stay active. You can always lose the weight gained after you quit.  Find ways to improve your mood other than smoking. FOR MORE INFORMATION  www.smokefree.gov  Document Released: 10/05/2001 Document Revised: 04/11/2012 Document Reviewed: 01/20/2012 [facility] Patient Information 2014 [place]

## 2013-12-14 NOTE — Addendum Note (Signed)
Addended by: Adria DillELDRIDGE-LEWIS, Jaivon Vanbeek L on: 12/14/2013 10:31 AM   Modules accepted: Orders

## 2014-02-08 ENCOUNTER — Encounter: Payer: Self-pay | Admitting: Internal Medicine

## 2014-02-08 ENCOUNTER — Ambulatory Visit (INDEPENDENT_AMBULATORY_CARE_PROVIDER_SITE_OTHER): Payer: Medicare Other | Admitting: Internal Medicine

## 2014-02-08 VITALS — BP 136/86 | HR 45 | Ht 70.0 in | Wt 175.4 lb

## 2014-02-08 DIAGNOSIS — Z720 Tobacco use: Secondary | ICD-10-CM

## 2014-02-08 DIAGNOSIS — E785 Hyperlipidemia, unspecified: Secondary | ICD-10-CM

## 2014-02-08 DIAGNOSIS — I6529 Occlusion and stenosis of unspecified carotid artery: Secondary | ICD-10-CM

## 2014-02-08 DIAGNOSIS — I4891 Unspecified atrial fibrillation: Secondary | ICD-10-CM

## 2014-02-08 DIAGNOSIS — Z9229 Personal history of other drug therapy: Secondary | ICD-10-CM

## 2014-02-08 DIAGNOSIS — R5381 Other malaise: Secondary | ICD-10-CM

## 2014-02-08 DIAGNOSIS — R5383 Other fatigue: Secondary | ICD-10-CM | POA: Diagnosis not present

## 2014-02-08 DIAGNOSIS — R0602 Shortness of breath: Secondary | ICD-10-CM

## 2014-02-08 DIAGNOSIS — I480 Paroxysmal atrial fibrillation: Secondary | ICD-10-CM

## 2014-02-08 DIAGNOSIS — I2510 Atherosclerotic heart disease of native coronary artery without angina pectoris: Secondary | ICD-10-CM

## 2014-02-08 DIAGNOSIS — I495 Sick sinus syndrome: Secondary | ICD-10-CM

## 2014-02-08 DIAGNOSIS — F17200 Nicotine dependence, unspecified, uncomplicated: Secondary | ICD-10-CM

## 2014-02-08 NOTE — Progress Notes (Signed)
OFFICE NOTE  Chief Complaint:  Routine follow-up  Primary Care Physician: Kirk RuthsMCGOUGH,WILLIAM M, MD  HPI:  Ortencia KickLeonard D Lorton is a pleasant 68 year old gentleman with a history of atrial flutter with rapid ventricular response. He converted to sinus with 2 occurrences. He was seen by EP and recommended amiodarone, which he had been tolerating well. At his last visit, I decreased that. He has also been on Xarelto with some nose bleeding; however, that has improved. He had been using Afrin and I have prescribed him for a Nasonex spray, which he alternated to try to get him off of the aspirin, which most likely was causing a vasomotor rhinitis. He has had no further episodes of epistaxis. He is doing fairly well on amiodarone; however, today we discussed the possible long-term toxicity of this medicine and how it would be more ideal for him to not be on it, given his younger age.  At his last office visit, we discussed possibly changing him off of amiodarone due to the risk of toxicity. I recommended multifactoral ever he cannot afford this. We also talked about changing to Xarelto however that was too expensive for him. He is currently gone back to amiodarone and aspirin. He does not have any recurrent A. fib that he or I are aware of. Unfortunately continues to smoke.  PMHx:  Past Medical History  Diagnosis Date  . Hyperlipidemia   . Atrial fibrillation     atrial flutter  . Coronary artery disease   . Pneumonia     hx of   . Shortness of breath   . Hypotension 04/11/2012  . SSS (sick sinus syndrome) 04/11/2012  . PAD (peripheral artery disease)   . History of nuclear stress test 08/2009    bruce myoview; normal pattern of perfusion; low risk   . Peripheral arterial disease     Past Surgical History  Procedure Laterality Date  . Carotid endarterectomy Left 04/2009    Dr. Darrick PennaFields  . Cardiac catheterization  08/2009    mod 2 vessel disease (L main 20-30% distal taper, LAD 50% stenosis  in prox protion, diagonal branch 60% segmental stenosis, Cfx hsa 60-70% eccentric stenosis in AV groove Cfx)   . Transthoracic echocardiogram  03/2012    EF 55-60%, normal LV systolic function; mild MR; LA mod dilated    FAMHx:  History reviewed. No pertinent family history.  SOCHx:   reports that he has been smoking Cigarettes.  He has a 100 pack-year smoking history. He has never used smokeless tobacco. He reports that he does not drink alcohol or use illicit drugs.  ALLERGIES:  Allergies  Allergen Reactions  . Aleve [Naproxen Sodium] Anaphylaxis and Shortness Of Breath  . Penicillins Hives and Itching    ROS: A comprehensive review of systems was negative except for: Cardiovascular: positive for irregular heart beat Musculoskeletal: positive for stiff joints  HOME MEDS: Current Outpatient Prescriptions  Medication Sig Dispense Refill  . ALPRAZolam (XANAX) 0.5 MG tablet       . amiodarone (PACERONE) 200 MG tablet Take 1 tablet (200 mg total) by mouth daily.  90 tablet  2  . aspirin 81 MG tablet Take 81 mg by mouth daily.      . betamethasone valerate (VALISONE) 0.1 % cream       . Cyanocobalamin (VITAMIN B-12 PO) Take 1 tablet by mouth daily.      . nitroGLYCERIN (NITROSTAT) 0.4 MG SL tablet Place 0.4 mg under the tongue every 5 (five) minutes  as needed for chest pain.      . pravastatin (PRAVACHOL) 40 MG tablet Take 1 tablet (40 mg total) by mouth every evening.  90 tablet  3  . phenylephrine (NEO-SYNEPHRINE) 1 % nasal spray Place 1 drop into the nose 3 (three) times daily.       No current facility-administered medications for this visit.    LABS/IMAGING: No results found for this or any previous visit (from the past 48 hour(s)). No results found.  VITALS: BP 136/86  Pulse 45  Ht 5\' 10"  (1.778 m)  Wt 175 lb 6.4 oz (79.561 kg)  BMI 25.17 kg/m2  EXAM: General appearance: alert and no distress Neck: no carotid bruit and no JVD Lungs: clear to auscultation  bilaterally Heart: regular rate and rhythm, S1, S2 normal, no murmur, click, rub or gallop Abdomen: soft, non-tender; bowel sounds normal; no masses,  no organomegaly Extremities: extremities normal, atraumatic, no cyanosis or edema Pulses: 2+ and symmetric Skin: Skin color, texture, turgor normal. No rashes or lesions or tanned Neurologic: Alert and oriented X 3, normal strength and tone. Normal symmetric reflexes. Normal coordination and gait Psych: Pleasant, normal  EKG: Sinus bradycardia at 45, QTC 415 ms  ASSESSMENT: 1. Paroxysmal atrial fibrillation on amiodarone 2. Chronic anticoagulation on Xarelto - CHADS2VASC score of 2 3. Dyslipidemia-intolerance to atorvastatin 4. History of carotid artery disease status post left carotid endarterectomy 5. CAD with moderate disease, non-obstructive 6. Asymptomatic bradycardia  PLAN: 1.   Mr. Onnie BoerMcFarling is doing well and this had no recurrence of his atrial fibrillation. He continues to be bradycardic with heart rates in the 40s but is asymptomatic. He has not had recurrence of his AF. He is on aspirin but does not wish to take any additional anticoagulation despite a CHADSVASC score of 2. I would recommend pulmonary function testing and a thyroid test as he is on amiodarone. He did have recent laboratory work from his primary care provider which shows normal renal and liver function. CBC is normal. Total cholesterol 164, triglycerides 76, HDL 51, LDL 98. Overall is doing well. Hold on statin with the results of his PFTs and TSH.  Chrystie NoseKenneth C. Hilty, MD, [facility]ACC Attending Cardiologist [facility]  Chrystie NoseKenneth C. Hilty 02/08/2014, 2:15 PM

## 2014-02-08 NOTE — Patient Instructions (Signed)
Your physician has recommended that you have a pulmonary function test. Pulmonary Function Tests are a group of tests that measure how well air moves in and out of your lungs. ** [facility]   Your physician recommends that you return for lab work at your earliest convenience.   Your physician wants you to follow-up in: 6 months.  You will receive a reminder letter in the mail two months in advance. If you don't receive a letter, please call our office to schedule the follow-up appointment.

## 2014-02-09 LAB — TSH: TSH: 2.599 u[IU]/mL (ref 0.350–4.500)

## 2014-02-11 ENCOUNTER — Other Ambulatory Visit (HOSPITAL_COMMUNITY): Payer: Self-pay | Admitting: Internal Medicine

## 2014-02-11 DIAGNOSIS — R0602 Shortness of breath: Secondary | ICD-10-CM

## 2014-02-15 ENCOUNTER — Telehealth: Payer: Self-pay | Admitting: Internal Medicine

## 2014-02-15 NOTE — Telephone Encounter (Signed)
Pt saw Dr Rennis GoldenHilty last Friday. He said Dr Rennis GoldenHilty wanted him to have [facility]. He still have not heard anything about this appointment.

## 2014-02-15 NOTE — Telephone Encounter (Signed)
Message forwarded to Scheduling to contact pt r/t appt.  

## 2014-02-21 ENCOUNTER — Telehealth: Payer: Self-pay | Admitting: Internal Medicine

## 2014-02-21 ENCOUNTER — Telehealth: Payer: Self-pay | Admitting: Diagnostic Radiology

## 2014-02-21 NOTE — Telephone Encounter (Signed)
Is returning your call .Marland Kitchen. Please call .Marland Kitchen. If no answer please call the cell number .Marland [phone]

## 2014-02-21 NOTE — Telephone Encounter (Signed)
Notified patient of normal tsh.   [facility] Will defer to scheduling

## 2014-02-21 NOTE — Progress Notes (Signed)
LMTCB

## 2014-02-21 NOTE — Telephone Encounter (Signed)
Patient call return. Notified of labs.

## 2014-02-21 NOTE — Telephone Encounter (Signed)
Returning your call. °

## 2014-02-25 ENCOUNTER — Other Ambulatory Visit (HOSPITAL_COMMUNITY): Payer: Self-pay | Admitting: Internal Medicine

## 2014-02-25 ENCOUNTER — Ambulatory Visit (HOSPITAL_COMMUNITY)
Admission: RE | Admit: 2014-02-25 | Discharge: 2014-02-25 | Disposition: A | Discharge status: Home / Self Care | Payer: Medicare Other | Source: Ambulatory Visit | Attending: Internal Medicine | Admitting: Internal Medicine

## 2014-02-25 DIAGNOSIS — J438 Other emphysema: Secondary | ICD-10-CM | POA: Diagnosis not present

## 2014-02-25 DIAGNOSIS — R109 Unspecified abdominal pain: Secondary | ICD-10-CM

## 2014-02-25 DIAGNOSIS — F17200 Nicotine dependence, unspecified, uncomplicated: Secondary | ICD-10-CM | POA: Insufficient documentation

## 2014-02-25 DIAGNOSIS — R918 Other nonspecific abnormal finding of lung field: Secondary | ICD-10-CM

## 2014-02-25 DIAGNOSIS — R3129 Other microscopic hematuria: Secondary | ICD-10-CM | POA: Diagnosis not present

## 2014-02-25 DIAGNOSIS — I4891 Unspecified atrial fibrillation: Secondary | ICD-10-CM | POA: Diagnosis not present

## 2014-02-25 DIAGNOSIS — I77811 Abdominal aortic ectasia: Secondary | ICD-10-CM | POA: Insufficient documentation

## 2014-02-25 DIAGNOSIS — N200 Calculus of kidney: Secondary | ICD-10-CM | POA: Insufficient documentation

## 2014-02-25 DIAGNOSIS — N201 Calculus of ureter: Secondary | ICD-10-CM | POA: Insufficient documentation

## 2014-02-25 DIAGNOSIS — I10 Essential (primary) hypertension: Secondary | ICD-10-CM | POA: Diagnosis not present

## 2014-02-25 DIAGNOSIS — IMO0002 Reserved for concepts with insufficient information to code with codable children: Secondary | ICD-10-CM | POA: Diagnosis not present

## 2014-03-01 ENCOUNTER — Encounter (HOSPITAL_COMMUNITY): Payer: Medicare Other

## 2014-03-11 ENCOUNTER — Ambulatory Visit (HOSPITAL_COMMUNITY)
Admission: RE | Admit: 2014-03-11 | Discharge: 2014-03-11 | Disposition: A | Discharge status: Home / Self Care | Payer: Medicare Other | Source: Ambulatory Visit | Attending: Internal Medicine | Admitting: Internal Medicine

## 2014-03-11 DIAGNOSIS — R0602 Shortness of breath: Secondary | ICD-10-CM | POA: Diagnosis not present

## 2014-03-11 MED ORDER — ALBUTEROL SULFATE (2.5 MG/3ML) 0.083% IN NEBU
2.5000 mg | INHALATION_SOLUTION | Freq: Once | RESPIRATORY_TRACT | Status: AC
  Administered 2014-03-11: 2.5 mg via RESPIRATORY_TRACT

## 2014-03-12 LAB — PULMONARY FUNCTION TEST
DL/VA % pred: 46 %
DL/VA: 2.13 ml/min/mmHg/L
DLCO cor % pred: 51 %
DLCO cor: 16.61 ml/min/mmHg
DLCO unc % pred: 51 %
DLCO unc: 16.61 ml/min/mmHg
FEF 25-75 Post: 1.75 L/sec
FEF 25-75 Pre: 1.63 L/sec
FEF2575-%CHANGE-POST: 7 %
FEF2575-%PRED-PRE: 60 %
FEF2575-%Pred-Post: 65 %
FEV1-%CHANGE-POST: 0 %
FEV1-%PRED-POST: 92 %
FEV1-%PRED-PRE: 92 %
FEV1-POST: 3.16 L
FEV1-Pre: 3.14 L
FEV1FVC-%CHANGE-POST: 0 %
FEV1FVC-%Pred-Pre: 90 %
FEV6-%CHANGE-POST: 1 %
FEV6-%Pred-Post: 107 %
FEV6-%Pred-Pre: 105 %
FEV6-Post: 4.65 L
FEV6-Pre: 4.58 L
FEV6FVC-%Change-Post: 0 %
FEV6FVC-%Pred-Post: 102 %
FEV6FVC-%Pred-Pre: 102 %
FVC-%Change-Post: 1 %
FVC-%Pred-Post: 103 %
FVC-%Pred-Pre: 102 %
FVC-Post: 4.75 L
FVC-Pre: 4.68 L
POST FEV1/FVC RATIO: 66 %
POST FEV6/FVC RATIO: 98 %
PRE FEV6/FVC RATIO: 98 %
Pre FEV1/FVC ratio: 67 %
RV % pred: 50 %
RV: 1.2 L
TLC % pred: 85 %
TLC: 6.03 L

## 2014-03-13 ENCOUNTER — Telehealth: Payer: Self-pay | Admitting: Internal Medicine

## 2014-03-13 DIAGNOSIS — R942 Abnormal results of pulmonary function studies: Secondary | ICD-10-CM

## 2014-03-13 DIAGNOSIS — Z79899 Other long term (current) drug therapy: Secondary | ICD-10-CM

## 2014-03-13 NOTE — Telephone Encounter (Signed)
[place] ,concerning his [facility]. Please call today,anxious for results.

## 2014-03-13 NOTE — Progress Notes (Signed)
LMTCB regarding test results

## 2014-03-13 NOTE — Telephone Encounter (Signed)
Spoke to patient.PFT Result given . Verbalized understanding  RN informed patient that Dr Blanchie DessertHilty's nurse Eileen StanfordJenna-  will contact scheduler to set up referral Patient request  mon ,tues, wed - late in the afternoon appointment.

## 2014-03-14 NOTE — Addendum Note (Signed)
Addended by: Lindell SparELKINS, JENNA M on: 03/14/2014 08:01 AM   Modules accepted: Orders

## 2014-03-14 NOTE — Telephone Encounter (Signed)
Order for pulmonary referral placed and information given to scheduler to set up.

## 2014-03-25 ENCOUNTER — Telehealth: Payer: Self-pay | Admitting: Internal Medicine

## 2014-03-25 NOTE — Telephone Encounter (Signed)
Please have Jenna call him asap,there seem to be a mix up in his pulmonary appointment. If not at this phone number ,he will be at [phone].

## 2014-03-25 NOTE — Telephone Encounter (Signed)
Pt. Wants you to call  Him about his pulmonary appt. [phone]

## 2014-03-25 NOTE — Telephone Encounter (Signed)
Pt. Wanted appt with Dr. Inis Sizer cancelled and he would keep appt. With  Dr. Juanetta Gosling in [place], [place] is going to call the pt. And let him know

## 2014-03-27 ENCOUNTER — Institutional Professional Consult (permissible substitution): Payer: Medicare Other | Admitting: Emergency Medicine

## 2014-04-17 DIAGNOSIS — J449 Chronic obstructive pulmonary disease, unspecified: Secondary | ICD-10-CM | POA: Diagnosis not present

## 2014-04-17 DIAGNOSIS — I4891 Unspecified atrial fibrillation: Secondary | ICD-10-CM | POA: Diagnosis not present

## 2014-05-08 ENCOUNTER — Telehealth: Payer: Self-pay | Admitting: Internal Medicine

## 2014-05-08 NOTE — Telephone Encounter (Signed)
I spoke to the pt. And let him know that Dr. Rennis GoldenHilty wanted him to stop taking the amiodarone, pt stated understanding of instructions

## 2014-05-08 NOTE — Telephone Encounter (Signed)
What can you tell me about [facility]

## 2014-05-08 NOTE — Telephone Encounter (Signed)
Pt said he had a breathing test about a month and half ago,followed up with Dr Juanetta GoslingHawkins. He felt that the Amiodarone 200 mg wasas causing it.He said he never heard from Dr Hilty,did he received the repot from Dr Juanetta GoslingHawkins?

## 2014-05-08 NOTE — Telephone Encounter (Signed)
D/c amiodarone - please try to get Dr. Juanetta GoslingHawkins office note.  Dr. HRexene Edison

## 2014-05-27 ENCOUNTER — Telehealth: Payer: Self-pay | Admitting: Internal Medicine

## 2014-05-28 NOTE — Telephone Encounter (Signed)
Closed encounter °

## 2014-07-31 ENCOUNTER — Telehealth: Payer: Self-pay | Admitting: Internal Medicine

## 2014-07-31 NOTE — Telephone Encounter (Signed)
Close encounter 

## 2014-08-08 ENCOUNTER — Ambulatory Visit (INDEPENDENT_AMBULATORY_CARE_PROVIDER_SITE_OTHER): Payer: Medicare Other | Admitting: Internal Medicine

## 2014-08-08 ENCOUNTER — Encounter: Payer: Self-pay | Admitting: Internal Medicine

## 2014-08-08 VITALS — BP 130/84 | HR 46 | Ht 69.5 in | Wt 178.7 lb

## 2014-08-08 DIAGNOSIS — I480 Paroxysmal atrial fibrillation: Secondary | ICD-10-CM

## 2014-08-08 DIAGNOSIS — I495 Sick sinus syndrome: Secondary | ICD-10-CM

## 2014-08-08 DIAGNOSIS — I6529 Occlusion and stenosis of unspecified carotid artery: Secondary | ICD-10-CM | POA: Diagnosis not present

## 2014-08-08 DIAGNOSIS — E785 Hyperlipidemia, unspecified: Secondary | ICD-10-CM | POA: Diagnosis not present

## 2014-08-08 DIAGNOSIS — Z9889 Other specified postprocedural states: Secondary | ICD-10-CM

## 2014-08-08 DIAGNOSIS — I2510 Atherosclerotic heart disease of native coronary artery without angina pectoris: Secondary | ICD-10-CM | POA: Diagnosis not present

## 2014-08-08 DIAGNOSIS — I2583 Coronary atherosclerosis due to lipid rich plaque: Principal | ICD-10-CM

## 2014-08-08 NOTE — Patient Instructions (Signed)
[facility]  [facility] [address]  [phone]  Your physician wants you to follow-up in: 6 months with Dr. Georgeann OppenheimHilty You will receive a reminder letter in the mail two months in advance. If you don't receive a letter, please call our office to schedule the follow-up appointment.

## 2014-08-08 NOTE — Progress Notes (Signed)
OFFICE NOTE  Chief Complaint:  Routine follow-up  Primary Care Physician: Kirk RuthsMCGOUGH,WILLIAM M, MD  HPI:  Ortencia KickLeonard D Ionescu is a pleasant 68 year old gentleman with a history of atrial flutter with rapid ventricular response. He converted to sinus with 2 occurrences. He was seen by EP and recommended amiodarone, which he had been tolerating well. At his last visit, I decreased that. He has also been on Xarelto with some nose bleeding; however, that has improved. He had been using Afrin and I have prescribed him for a Nasonex spray, which he alternated to try to get him off of the aspirin, which most likely was causing a vasomotor rhinitis. He has had no further episodes of epistaxis. He is doing fairly well on amiodarone; however, today we discussed the possible long-term toxicity of this medicine and how it would be more ideal for him to not be on it, given his younger age.  At his last office visit, we discussed possibly changing him off of amiodarone due to the risk of toxicity. I recommended multifactoral ever he cannot afford this. We also talked about changing to Xarelto however that was too expensive for him. He is currently gone back to amiodarone and aspirin. He does not have any recurrent A. fib that he or I are aware of. Unfortunately continues to smoke.  Mr. Harriette BouillonMcFarland returns today for followup. He is feeling quite well. He did undergo pulmonary function testing which interestingly showed no evidence of COPD, despite his long smoking history. 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 Based on these findings we discontinued his amiodarone. He is maintaining sinus rhythm today.  PMHx:  Past Medical History  Diagnosis Date  . Hyperlipidemia   . Atrial fibrillation     atrial flutter  . Coronary artery disease   . Pneumonia     hx of   . Shortness of breath   . Hypotension 04/11/2012  . SSS (sick sinus syndrome) 04/11/2012    . PAD (peripheral artery disease)   . History of nuclear stress test 08/2009    bruce myoview; normal pattern of perfusion; low risk   . Peripheral arterial disease     Past Surgical History  Procedure Laterality Date  . Carotid endarterectomy Left 04/2009    Dr. Darrick PennaFields  . Cardiac catheterization  08/2009    mod 2 vessel disease (L main 20-30% distal taper, LAD 50% stenosis in prox protion, diagonal branch 60% segmental stenosis, Cfx hsa 60-70% eccentric stenosis in AV groove Cfx)   . Transthoracic echocardiogram  03/2012    EF 55-60%, normal LV systolic function; mild MR; LA mod dilated    FAMHx:  History reviewed. No pertinent family history.  SOCHx:   reports that he has been smoking Cigarettes.  He has a 100 pack-year smoking history. He has never used smokeless tobacco. He reports that he does not drink alcohol or use illicit drugs.  ALLERGIES:  Allergies  Allergen Reactions  . Aleve [Naproxen Sodium] Anaphylaxis and Shortness Of Breath  . Penicillins Hives and Itching    ROS: A comprehensive review of systems was negative.  HOME MEDS: Current Outpatient Prescriptions  Medication Sig Dispense Refill  . ALPRAZolam (XANAX) 0.5 MG tablet       . aspirin 81 MG tablet Take 81 mg by mouth daily.      . betamethasone valerate (VALISONE) 0.1 % cream       . Cyanocobalamin (VITAMIN B-12 PO) Take 1 tablet  by mouth daily.      . nitroGLYCERIN (NITROSTAT) 0.4 MG SL tablet Place 0.4 mg under the tongue every 5 (five) minutes as needed for chest pain.      . phenylephrine (NEO-SYNEPHRINE) 1 % nasal spray Place 1 drop into the nose 3 (three) times daily.      . pravastatin (PRAVACHOL) 40 MG tablet Take 1 tablet (40 mg total) by mouth every evening.  90 tablet  3   No current facility-administered medications for this visit.    LABS/IMAGING: No results found for this or any previous visit (from the past 48 hour(s)). No results found.  VITALS: BP 130/84  Pulse 46  Ht 5' 9.5"  (1.765 m)  Wt 178 lb 11.2 oz (81.058 kg)  BMI 26.02 kg/m2  EXAM: General appearance: alert and no distress Neck: no carotid bruit and no JVD Lungs: clear to auscultation bilaterally Heart: regular rate and rhythm, S1, S2 normal, no murmur, click, rub or gallop Abdomen: soft, non-tender; bowel sounds normal; no masses,  no organomegaly Extremities: extremities normal, atraumatic, no cyanosis or edema Pulses: 2+ and symmetric Skin: Skin color, texture, turgor normal. No rashes or lesions or tanned Neurologic: Alert and oriented X 3, normal strength and tone. Normal symmetric reflexes. Normal coordination and gait Psych: Pleasant, normal  EKG: Sinus bradycardia at 46  ASSESSMENT: 1. Paroxysmal atrial fibrillation - amiodarone d/c'd due to abnormal DLCO 2. Chronic anticoagulation on Xarelto - CHADS2VASC score of 2 3. Dyslipidemia-intolerance to atorvastatin 4. History of carotid artery disease status post left carotid endarterectomy 5. CAD with moderate disease, non-obstructive 6. Asymptomatic bradycardia  PLAN: 1.   Mr. Onnie BoerMcFarling is doing well and this had no recurrence of his atrial fibrillation. He had recent thyroid function tests which were normal. His primary function testing did indicate a reduced DLCO. Amiodarone was discontinued, but he notes no difference in his shortness of breath. Unfortunately continues to smoke. From a cardiac standpoint he seems stable. He's been intolerant to statins in the past. He continues to have asymptomatic bradycardia. I recommend a repeat lipid profile to see where he is at in May recommend an alternative statin medication.  Chrystie NoseKenneth C. Hilty, MD, [facility] Attending Cardiologist [facility]  HILTY,Kenneth C 08/08/2014, 4:36 PM

## 2014-08-09 DIAGNOSIS — E785 Hyperlipidemia, unspecified: Secondary | ICD-10-CM | POA: Diagnosis not present

## 2014-08-09 LAB — LIPID PANEL
Cholesterol: 151 mg/dL (ref 0–200)
HDL: 42 mg/dL
LDL Cholesterol: 90 mg/dL (ref 0–99)
Total CHOL/HDL Ratio: 3.6 ratio
Triglycerides: 94 mg/dL
VLDL: 19 mg/dL (ref 0–40)

## 2014-08-12 ENCOUNTER — Encounter: Payer: Self-pay | Admitting: *Deleted

## 2014-08-12 NOTE — Progress Notes (Signed)
Quick Note:  Cholesterol looks good. No changes.  Dr. Rexene EdisonH ______

## 2014-08-13 ENCOUNTER — Other Ambulatory Visit: Payer: Self-pay | Admitting: Internal Medicine

## 2014-08-13 NOTE — Telephone Encounter (Signed)
Rx was sent to pharmacy electronically. 

## 2014-09-03 ENCOUNTER — Ambulatory Visit (HOSPITAL_COMMUNITY)
Admission: RE | Admit: 2014-09-03 | Discharge: 2014-09-03 | Disposition: A | Discharge status: Home / Self Care | Payer: Medicare Other | Source: Ambulatory Visit | Attending: Family Medicine | Admitting: Family Medicine

## 2014-09-03 ENCOUNTER — Other Ambulatory Visit: Payer: Self-pay | Admitting: Urology

## 2014-09-03 ENCOUNTER — Encounter (HOSPITAL_COMMUNITY): Payer: Self-pay

## 2014-09-03 ENCOUNTER — Other Ambulatory Visit (HOSPITAL_COMMUNITY): Payer: Self-pay | Admitting: Family Medicine

## 2014-09-03 DIAGNOSIS — Z6824 Body mass index (BMI) 24.0-24.9, adult: Secondary | ICD-10-CM | POA: Diagnosis not present

## 2014-09-03 DIAGNOSIS — K561 Intussusception: Secondary | ICD-10-CM | POA: Insufficient documentation

## 2014-09-03 DIAGNOSIS — N132 Hydronephrosis with renal and ureteral calculous obstruction: Secondary | ICD-10-CM | POA: Diagnosis not present

## 2014-09-03 DIAGNOSIS — N200 Calculus of kidney: Secondary | ICD-10-CM | POA: Diagnosis not present

## 2014-09-03 DIAGNOSIS — R1031 Right lower quadrant pain: Secondary | ICD-10-CM

## 2014-09-03 MED ORDER — IOHEXOL 300 MG/ML  SOLN
100.0000 mL | Freq: Once | INTRAMUSCULAR | Status: AC | PRN
  Administered 2014-09-03: 100 mL via INTRAVENOUS

## 2014-09-04 ENCOUNTER — Encounter (HOSPITAL_COMMUNITY): Payer: Self-pay

## 2014-09-04 MED ORDER — GENTAMICIN SULFATE 40 MG/ML IJ SOLN
400.0000 mg | INTRAVENOUS | Status: AC
  Administered 2014-09-05: 400 mg via INTRAVENOUS
  Filled 2014-09-04: qty 10

## 2014-09-05 ENCOUNTER — Ambulatory Visit (HOSPITAL_COMMUNITY): Payer: Medicare Other

## 2014-09-05 ENCOUNTER — Encounter (HOSPITAL_COMMUNITY): Payer: Self-pay | Admitting: *Deleted

## 2014-09-05 ENCOUNTER — Ambulatory Visit (HOSPITAL_COMMUNITY)
Admission: RE | Admit: 2014-09-05 | Discharge: 2014-09-05 | Disposition: A | Discharge status: Home / Self Care | Payer: Medicare Other | Source: Ambulatory Visit | Attending: Urology | Admitting: Urology

## 2014-09-05 ENCOUNTER — Encounter (HOSPITAL_COMMUNITY)
Admission: RE | Disposition: A | Discharge status: Home / Self Care | Payer: Self-pay | Source: Ambulatory Visit | Attending: Urology

## 2014-09-05 DIAGNOSIS — I495 Sick sinus syndrome: Secondary | ICD-10-CM | POA: Diagnosis not present

## 2014-09-05 DIAGNOSIS — Z886 Allergy status to analgesic agent status: Secondary | ICD-10-CM | POA: Diagnosis not present

## 2014-09-05 DIAGNOSIS — E785 Hyperlipidemia, unspecified: Secondary | ICD-10-CM | POA: Diagnosis not present

## 2014-09-05 DIAGNOSIS — N201 Calculus of ureter: Secondary | ICD-10-CM | POA: Diagnosis not present

## 2014-09-05 DIAGNOSIS — I480 Paroxysmal atrial fibrillation: Secondary | ICD-10-CM | POA: Diagnosis not present

## 2014-09-05 DIAGNOSIS — Z820 Family history of epilepsy and other diseases of the nervous system: Secondary | ICD-10-CM | POA: Insufficient documentation

## 2014-09-05 DIAGNOSIS — Z882 Allergy status to sulfonamides status: Secondary | ICD-10-CM | POA: Diagnosis not present

## 2014-09-05 DIAGNOSIS — Z880 Allergy status to penicillin: Secondary | ICD-10-CM | POA: Diagnosis not present

## 2014-09-05 DIAGNOSIS — I6529 Occlusion and stenosis of unspecified carotid artery: Secondary | ICD-10-CM | POA: Diagnosis not present

## 2014-09-05 DIAGNOSIS — I2510 Atherosclerotic heart disease of native coronary artery without angina pectoris: Secondary | ICD-10-CM | POA: Insufficient documentation

## 2014-09-05 HISTORY — DX: Calculus of kidney: N20.0

## 2014-09-05 HISTORY — DX: Unspecified osteoarthritis, unspecified site: M19.90

## 2014-09-05 SURGERY — LITHOTRIPSY, ESWL
Anesthesia: LOCAL | Laterality: Right

## 2014-09-05 MED ORDER — HYDROMORPHONE HCL 1 MG/ML IJ SOLN
1.0000 mg | Freq: Once | INTRAMUSCULAR | Status: AC
  Administered 2014-09-05: 1 mg via INTRAVENOUS
  Filled 2014-09-05: qty 1

## 2014-09-05 MED ORDER — DIPHENHYDRAMINE HCL 25 MG PO CAPS
25.0000 mg | ORAL_CAPSULE | ORAL | Status: AC
  Administered 2014-09-05: 25 mg via ORAL
  Filled 2014-09-05: qty 1

## 2014-09-05 MED ORDER — DIAZEPAM 5 MG PO TABS
10.0000 mg | ORAL_TABLET | ORAL | Status: AC
  Administered 2014-09-05: 10 mg via ORAL
  Filled 2014-09-05: qty 2

## 2014-09-05 MED ORDER — OXYCODONE-ACETAMINOPHEN 5-325 MG PO TABS: 1.0000 | ORAL_TABLET | ORAL | Status: DC | PRN

## 2014-09-05 MED ORDER — SODIUM CHLORIDE 0.9 % IV SOLN
INTRAVENOUS | Status: DC
  Administered 2014-09-05: 16:00:00 via INTRAVENOUS

## 2014-09-05 NOTE — Discharge Instructions (Signed)
1 - You may have urinary urgency (bladder spasms), bloody urine, and passage of small stone fragments on / off x few days. This is normal. Please strain urine to collect stone fragments for later analysis and bring to next Urology appointment.  2 - Call MD or go to ER for fever >102, severe pain / nausea / vomiting not relieved by medications, or acute change in medical status

## 2014-09-05 NOTE — Brief Op Note (Signed)
09/05/2014  6:36 PM  PATIENT:  Shawn [name]  68 y.o. male  PRE-OPERATIVE DIAGNOSIS:  RIGHT URETERAL STONE  POST-OPERATIVE DIAGNOSIS:  * No post-op diagnosis entered *  PROCEDURE:  Procedure(s): RIGHT EXTRACORPOREAL SHOCK WAVE LITHOTRIPSY (ESWL) (Right), First Stage  SURGEON:  Surgeon(s) and Role:    * Sebastian Acheheodore Imani Sherrin, MD - Primary  PHYSICIAN ASSISTANT:   ASSISTANTS: none   ANESTHESIA:   IV sedation  EBL:     BLOOD ADMINISTERED:none  DRAINS: none   LOCAL MEDICATIONS USED:  NONE  SPECIMEN:  No Specimen  DISPOSITION OF SPECIMEN:  N/A  COUNTS:  YES  TOURNIQUET:  * No tourniquets in log *  DICTATION: .Other Dictation: Dictation Number please see scanned lithotripsy note for details  PLAN OF CARE: Discharge to home after PACU  PATIENT DISPOSITION:  PACU - hemodynamically stable.   Delay start of Pharmacological VTE agent (>24hrs) due to surgical blood loss or risk of bleeding: yes

## 2014-09-05 NOTE — H&P (Signed)
Shawn [name] is an 68 y.o. male.    Chief Complaint: Pre-OP Right Shockwave Lithotripsy  HPI:   1- Nephrolithiasis - Rt distal 6mm ureteral stone by CT 09/03/2014 on eval RLQ pain. Stone 700 HU, SSD 10cm, no additional or contralateral stones. Stone at level of lower 1/2 femoral head close to anterior pubic ramus by CT scout image.   PMH sig for rate-controlled AFib (no strong blood thinners), carotid surgery. His PCP is Dr. Phillips OdorGolding with [facility] [place].   Today " Shawn [name] " is seen to proceed with right shockwave lithotripsy. No interval fevers or stone passage. He has bene on medical therapy in interval with pain meds and alpha blockers.  Last ASA dose 3 days ago.   Past Medical History  Diagnosis Date  . Hyperlipidemia   . Atrial fibrillation     atrial flutter  . Coronary artery disease   . Pneumonia     hx of   . Shortness of breath   . Hypotension 04/11/2012  . SSS (sick sinus syndrome) 04/11/2012  . PAD (peripheral artery disease)   . History of nuclear stress test 08/2009    bruce myoview; normal pattern of perfusion; low risk   . Peripheral arterial disease   . Kidney stones about 2000, and feb 2015    passed the 2015 stone    Past Surgical History  Procedure Laterality Date  . Carotid endarterectomy Left 04/2009    Dr. Darrick PennaFields  . Cardiac catheterization  08/2009    mod 2 vessel disease (L main 20-30% distal taper, LAD 50% stenosis in prox protion, diagonal branch 60% segmental stenosis, Cfx hsa 60-70% eccentric stenosis in AV groove Cfx)   . Transthoracic echocardiogram  03/2012    EF 55-60%, normal LV systolic function; mild MR; LA mod dilated  . Cystoscopy/retrograde/ureteroscopy/stone extraction with basket  about 2000    No family history on file. Social History:  reports that he has been smoking Cigarettes.  He has a 100 pack-year smoking history. He has never used smokeless tobacco. He reports that he does not drink alcohol or use illicit  drugs.  Allergies:  Allergies  Allergen Reactions  . Aleve [Naproxen Sodium] Anaphylaxis and Shortness Of Breath  . Penicillins Hives and Itching  . Sulfa Antibiotics Hives    No prescriptions prior to admission    No results found for this or any previous visit (from the past 48 hour(s)). Ct Abdomen Pelvis W Contrast  09/03/2014   CLINICAL DATA:  Right lower quadrant abdominal pain.  EXAM: CT ABDOMEN AND PELVIS WITH CONTRAST  TECHNIQUE: Multidetector CT imaging of the abdomen and pelvis was performed using the standard protocol following bolus administration of intravenous contrast.  CONTRAST:  100mL OMNIPAQUE IOHEXOL 300 MG/ML  SOLN  COMPARISON:  02/25/2014  FINDINGS: BODY WALL: Unremarkable.  LOWER CHEST: No interval growth of a subpleural nodule along the lower right major fissure. Dedicated chest CT has been acquired 02/25/2014. Centrilobular emphysema in the lower lobes.  ABDOMEN/PELVIS:  Liver: Negative.  Biliary: No evidence of biliary obstruction or stone.  Pancreas: Unremarkable.  Spleen: Unremarkable.  Adrenals: Unremarkable.  Kidneys and ureters: Right hydronephrosis and extensive perinephric edema secondary to a 6 x 4 mm stone at the right ureteral vesicular junction. There is also delayed right urinary excretion. Punctate interpolar calculus on the right. Previously seen left upper pole calculus is currently not visible. No left hydronephrosis.  Bladder: Unremarkable.  Reproductive: Vasectomy clips.  Bowel: Extensive distal colonic diverticulosis. No evidence  of bowel obstruction or inflammation. There is a 2 cm long enteroenteric intussusception which is likely incidental and transient. No intussusception seen on recent abdominal CT. No causative mass lesion identified. Negative appendix appearing  Retroperitoneum: No mass or adenopathy.  Peritoneum: No ascites or pneumoperitoneum.  Vascular: Extensive atherosclerosis for age with irregular calcified and noncalcified plaque throughout  the abdominal aorta. Mild ectasia of the infrarenal aorta, 2.4 cm in maximal diameter.  OSSEOUS: No acute abnormalities.  IMPRESSION: 1. Obstructing 6 x 4 mm stone at the right UVJ. 2. Nonobstructive, short enteroenteric intussusception. This is usually transient/incidental. 3. Chronic/incidental findings are stable from prior and discussed above.   Electronically Signed   By: Tiburcio PeaJonathan  Watts M.D.   On: 09/03/2014 12:04    Review of Systems  Constitutional: Negative.  Negative for fever and chills.  HENT: Negative.   Eyes: Negative.   Respiratory: Negative.   Cardiovascular: Negative.   Gastrointestinal: Negative.   Genitourinary: Positive for flank pain. Negative for dysuria.  Musculoskeletal: Negative.   Skin: Negative.   Neurological: Negative.   Endo/Heme/Allergies: Negative.   Psychiatric/Behavioral: Negative.     There were no vitals taken for this visit. Physical Exam  Constitutional: He appears well-developed.  HENT:  Head: Normocephalic.  Eyes: Pupils are equal, round, and reactive to light.  Neck: Normal range of motion.  Cardiovascular: Normal rate.   Respiratory: Effort normal.  Genitourinary: Penis normal.  Mild Rt CVAT  Musculoskeletal: Normal range of motion.  Neurological: He is alert.  Skin: Skin is warm.  Psychiatric: He has a normal mood and affect. His behavior is normal. Judgment and thought content normal.     Assessment/Plan    1- Nephrolithiasis -  We rediscussed shockwave lithotripsy in detail as well as my "rule of 9s" with stones <529mm, less than 900 HU, and skin to stone distance <9cm having approximately 90% treatment success with single session of treatment. We then readdressed how stones that are larger, more dense, and in patients with less favorable anatomy have incrementally decreased success rates. We rediscussed risks including, bleeding, infection, hematoma, loss of kidney, need for staged therapy, need for adjunctive therapy and requirement to  refrain from any anticoagulants, anti-platelet or aspirin-like products peri-procedureally.   After careful consideration, the patient has chosen to proceed today as planned  [facility], Ravenna Legore 09/05/2014, 8:40 AM

## 2014-09-23 DIAGNOSIS — N200 Calculus of kidney: Secondary | ICD-10-CM | POA: Diagnosis not present

## 2014-09-30 DIAGNOSIS — N200 Calculus of kidney: Secondary | ICD-10-CM | POA: Diagnosis not present

## 2014-12-18 ENCOUNTER — Encounter: Payer: Self-pay | Admitting: Family

## 2014-12-19 ENCOUNTER — Ambulatory Visit (INDEPENDENT_AMBULATORY_CARE_PROVIDER_SITE_OTHER): Payer: Medicare Other | Admitting: Family

## 2014-12-19 ENCOUNTER — Encounter: Payer: Self-pay | Admitting: Family

## 2014-12-19 ENCOUNTER — Ambulatory Visit (HOSPITAL_COMMUNITY)
Admission: RE | Admit: 2014-12-19 | Discharge: 2014-12-19 | Disposition: A | Discharge status: Home / Self Care | Payer: Medicare Other | Source: Ambulatory Visit | Attending: Family | Admitting: Family

## 2014-12-19 VITALS — BP 121/77 | HR 50 | Resp 14 | Ht 70.5 in | Wt 174.0 lb

## 2014-12-19 DIAGNOSIS — Z720 Tobacco use: Secondary | ICD-10-CM | POA: Diagnosis not present

## 2014-12-19 DIAGNOSIS — I6523 Occlusion and stenosis of bilateral carotid arteries: Secondary | ICD-10-CM | POA: Diagnosis not present

## 2014-12-19 DIAGNOSIS — F17200 Nicotine dependence, unspecified, uncomplicated: Secondary | ICD-10-CM

## 2014-12-19 DIAGNOSIS — Z48812 Encounter for surgical aftercare following surgery on the circulatory system: Secondary | ICD-10-CM | POA: Diagnosis not present

## 2014-12-19 DIAGNOSIS — I6521 Occlusion and stenosis of right carotid artery: Secondary | ICD-10-CM

## 2014-12-19 DIAGNOSIS — Z9889 Other specified postprocedural states: Secondary | ICD-10-CM | POA: Insufficient documentation

## 2014-12-19 NOTE — Progress Notes (Signed)
Established Carotid Patient   History of Present Illness  Shawn [name] is a 69 y.o. male patient of [name] who is status post left CEA in 2010 for 95%+ stenosis. He returns today for follow up. He had a TIA as manifested by transient expressive aphasia just before the 2010 CEA; he denies hemiparesis, denies amaurosis fugax, denies facial drooping. He denies claudication symptoms. He does not use ETOH, walks a great deal as part of his job. Patient denies New Medical or Surgical History.  Pt Diabetic: No Pt smoker: smoker (2 ppd x 50 yrs)  Pt meds include: Statin : Yes ASA: Yes Other anticoagulants/antiplatelets: no, was taking Xaralto, due to easy bleeding in skin, stopped by his cardiologist. No atrial flutter since hospitalized for this in 2014    Past Medical History  Diagnosis Date  . Hyperlipidemia   . Atrial fibrillation     atrial flutter  . Coronary artery disease   . Pneumonia     hx of   . Shortness of breath   . Hypotension 04/11/2012  . SSS (sick sinus syndrome) 04/11/2012  . PAD (peripheral artery disease)   . History of nuclear stress test 08/2009    bruce myoview; normal pattern of perfusion; low risk   . Peripheral arterial disease   . Kidney stones about 2000, and feb 2015    passed the 2015 stone  . Arthritis     DDD lower spine  . Carotid artery occlusion     Social History History  Substance Use Topics  . Smoking status: Current Every Day Smoker -- 2.00 packs/day for 50 years    Types: Cigarettes  . Smokeless tobacco: Never Used     Comment: smokes 1.5 ppd (08/07/13)  . Alcohol Use: No    Family History Family History  Problem Relation Age of Onset  . Hypertension Mother   . Hypertension Father     Surgical History Past Surgical History  Procedure Laterality Date  . Cardiac catheterization  08/2009    mod 2 vessel disease (L main 20-30% distal taper, LAD 50% stenosis in prox protion, diagonal branch 60% segmental  stenosis, Cfx hsa 60-70% eccentric stenosis in AV groove Cfx)   . Transthoracic echocardiogram  03/2012    EF 55-60%, normal LV systolic function; mild MR; LA mod dilated  . Cystoscopy/retrograde/ureteroscopy/stone extraction with basket  about 2000  . Carotid endarterectomy Left May 21, 2009    [facility] , Dr. Moishe Spice    Allergies  Allergen Reactions  . Aleve [Naproxen Sodium] Anaphylaxis and Shortness Of Breath  . Penicillins Hives and Itching  . Sulfa Antibiotics Hives    Current Outpatient Prescriptions  Medication Sig Dispense Refill  . aspirin 81 MG tablet Take 81 mg by mouth daily.    . betamethasone valerate (VALISONE) 0.1 % cream Apply 1 application topically daily as needed (rash).     . Cyanocobalamin (VITAMIN B-12 PO) Take 1 tablet by mouth daily.    . pravastatin (PRAVACHOL) 40 MG tablet TAKE 1 TABLET BY MOUTH ONCE DAILY IN THE EVENING 90 tablet 3  . ALPRAZolam (XANAX) 0.5 MG tablet Take 0.5 mg by mouth at bedtime as needed for anxiety.     Marland Kitchen ibuprofen (ADVIL,MOTRIN) 200 MG tablet Take 400 mg by mouth every 6 (six) hours as needed for headache (headache).    . nitroGLYCERIN (NITROSTAT) 0.4 MG SL tablet Place 0.4 mg under the tongue every 5 (five) minutes as needed for chest pain.    Marland Kitchen  oxyCODONE-acetaminophen (ROXICET) 5-325 MG per tablet Take 1-2 tablets by mouth every 4 (four) hours as needed for moderate pain or severe pain. After kidney stone treatment. (Patient not taking: Reported on 12/19/2014) 30 tablet 0  . phenylephrine (NEO-SYNEPHRINE) 1 % nasal spray Place 1 drop into the nose 3 (three) times daily.    . pravastatin (PRAVACHOL) 40 MG tablet Take 40 mg by mouth every evening.     No current facility-administered medications for this visit.    Review of Systems : See HPI for pertinent positives and negatives.  Physical Examination  Filed Vitals:   12/19/14 1548 12/19/14 1552  BP: 121/76 121/77  Pulse: 51 50  Resp:  14  Height:  5' 10.5" (1.791 m)  Weight:   174 lb (78.926 kg)  SpO2:  98%   Body mass index is 24.61 kg/(m^2).  General: WDWN male in NAD GAIT: normal Eyes: PERRLA Pulmonary: Non-labored, CTAB, diminished air movement in all fields, Negative Rales, Negative rhonchi, & Negative wheezing.  Cardiac: sinus bradycardia Rhythm , Negative detected murmur.  VASCULAR EXAM Carotid Bruits Left Right   Negative Negative   Radial pulses are 2+ palpable and equal.      LE Pulses LEFT RIGHT   POPLITEAL not palpable  not palpable   POSTERIOR TIBIAL  palpable   palpable     Gastrointestinal: soft, nontender, BS WNL, no r/g, no palpable masses.  Musculoskeletal: Negative muscle atrophy/wasting. M/S 5/5 throughout, Extremities without ischemic changes.  Neurologic: A&O X 3; Appropriate Affect ; SENSATION ;normal;  Speech is normal CN 2-12 intact except, Pain and light touch intact in extremities, Motor exam as listed above.         Non-Invasive Vascular Imaging CAROTID DUPLEX 12/19/2014   CEREBROVASCULAR DUPLEX EVALUATION    INDICATION: Carotid endarterectomy     PREVIOUS INTERVENTION(S): Left carotid endarterectomy on 05/21/09    DUPLEX EXAM:     RIGHT  LEFT  Peak Systolic Velocities (cm/s) End Diastolic Velocities (cm/s) Plaque LOCATION Peak Systolic Velocities (cm/s) End Diastolic Velocities (cm/s) Plaque  94 29  CCA PROXIMAL 77 23   71 26  CCA MID 80 28   85 28 HT CCA DISTAL 89 31   75 17  ECA 72 19   72 32 HT ICA PROXIMAL 65 23   94 43  ICA MID 103 43   64 29  ICA DISTAL 69 28     0.85 ICA / CCA Ratio (PSV) Not Calculated  Antegrade Vertebral Flow Antegrade   Brachial Systolic Pressure (mmHg)   Multiphasic (subclavian artery) Brachial Artery Waveforms Multiphasic (subclavian artery)    Plaque Morphology:  HM = Homogeneous, HT = Heterogeneous, CP =  Calcific Plaque, SP = Smooth Plaque, IP = Irregular Plaque     ADDITIONAL FINDINGS: No significant stenosis of the bilateral external or common carotid arteries.    IMPRESSION: 1. Patent left carotid endarterectomy site with no left internal carotid artery stenosis. 2. Doppler velocities suggest a less than 40% stenosis of the right proximal internal carotid artery.    Compared to the previous exam:  No significant change noted when compared to the previous exam on 12/13/13.       Assessment: Shawn [name] is a 69 y.o. male who is status post left CEA in 2010 for 95%+ stenosis. He presents with asymptomatic patent left carotid endarterectomy site with no left internal carotid artery stenosis and  less than 40% stenosis of the right proximal internal carotid artery. No significant change  noted when compared to the previous exam on 12/13/13.  Unfortunately he continues to smoke.  Plan: Follow-up in 1 year with Carotid Duplex.  The patient was counseled re smoking cessation and given several free resources re smoking cessation.   I discussed in depth with the patient the nature of atherosclerosis, and emphasized the importance of maximal medical management including strict control of blood pressure, blood glucose, and lipid levels, obtaining regular exercise, and cessation of smoking.  The patient is aware that without maximal medical management the underlying atherosclerotic disease process will progress, limiting the benefit of any interventions. The patient was given information about stroke prevention and what symptoms should prompt the patient to seek immediate medical care. Thank you for allowing [place] to participate in this patient's care.  Charisse March, RN, MSN, FNP-C [facility] Office: [phone]  Clinic Physician: Imogene Burn  12/19/2014 4:12 PM

## 2015-02-05 ENCOUNTER — Encounter: Payer: Self-pay | Admitting: Internal Medicine

## 2015-02-05 ENCOUNTER — Ambulatory Visit (INDEPENDENT_AMBULATORY_CARE_PROVIDER_SITE_OTHER): Payer: Medicare Other | Admitting: Internal Medicine

## 2015-02-05 VITALS — BP 114/80 | HR 51 | Ht 72.0 in | Wt 170.5 lb

## 2015-02-05 DIAGNOSIS — I2510 Atherosclerotic heart disease of native coronary artery without angina pectoris: Secondary | ICD-10-CM | POA: Diagnosis not present

## 2015-02-05 DIAGNOSIS — I495 Sick sinus syndrome: Secondary | ICD-10-CM | POA: Diagnosis not present

## 2015-02-05 DIAGNOSIS — E785 Hyperlipidemia, unspecified: Secondary | ICD-10-CM

## 2015-02-05 DIAGNOSIS — I6523 Occlusion and stenosis of bilateral carotid arteries: Secondary | ICD-10-CM | POA: Diagnosis not present

## 2015-02-05 DIAGNOSIS — I480 Paroxysmal atrial fibrillation: Secondary | ICD-10-CM

## 2015-02-05 DIAGNOSIS — I2583 Coronary atherosclerosis due to lipid rich plaque: Secondary | ICD-10-CM

## 2015-02-05 NOTE — Progress Notes (Signed)
OFFICE NOTE  Chief Complaint:  Routine follow-up  Primary Care Physician: Colette Ribas, MD  HPI:  Shawn [name] is a pleasant 69 year old gentleman with a history of atrial flutter with rapid ventricular response. He converted to sinus with 2 occurrences. He was seen by EP and recommended amiodarone, which he had been tolerating well. At his last visit, I decreased that. He has also been on Xarelto with some nose bleeding; however, that has improved. He had been using Afrin and I have prescribed him for a Nasonex spray, which he alternated to try to get him off of the aspirin, which most likely was causing a vasomotor rhinitis. He has had no further episodes of epistaxis. He is doing fairly well on amiodarone; however, today we discussed the possible long-term toxicity of this medicine and how it would be more ideal for him to not be on it, given his younger age.  At his last office visit, we discussed possibly changing him off of amiodarone due to the risk of toxicity. I recommended multifactoral ever he cannot afford this. We also talked about changing to Xarelto however that was too expensive for him. He is currently gone back to amiodarone and aspirin. He does not have any recurrent A. fib that he or I are aware of. Unfortunately continues to smoke.  [name] for followup. He is feeling quite well. He did undergo pulmonary function testing which interestingly showed no evidence of COPD, despite his long smoking history. 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 Based on these findings we discontinued his amiodarone. He is maintaining sinus rhythm today.  I saw [name] back in the office today. Overall he is doing well. He's had no recurrent A. fib that were aware of. [name] He had taken himself off of Xarelto due to nosebleeds. He's currently on aspirin.  His CHADSVASC score is 2, therefore anticoagulation with warfarin or a DOAC is indicated for stroke reduction. We discussed this at length today, but he is concerned about ongoing bleeding risks and wishes to stay on aspirin.  PMHx:  Past Medical History  Diagnosis Date  . Hyperlipidemia   . Atrial fibrillation     atrial flutter  . Coronary artery disease   . Pneumonia     hx of   . Shortness of breath   . Hypotension 04/11/2012  . SSS (sick sinus syndrome) 04/11/2012  . PAD (peripheral artery disease)   . History of nuclear stress test 08/2009    bruce myoview; normal pattern of perfusion; low risk   . Peripheral arterial disease   . Kidney stones about 2000, and feb 2015    passed the 2015 stone  . Arthritis     DDD lower spine  . Carotid artery occlusion     Past Surgical History  Procedure Laterality Date  . Cardiac catheterization  08/2009    mod 2 vessel disease (L main 20-30% distal taper, LAD 50% stenosis in prox protion, diagonal branch 60% segmental stenosis, Cfx hsa 60-70% eccentric stenosis in AV groove Cfx)   . Transthoracic echocardiogram  03/2012    EF 55-60%, normal LV systolic function; mild MR; LA mod dilated  . Cystoscopy/retrograde/ureteroscopy/stone extraction with basket  about 2000  . Carotid endarterectomy Left May 21, 2009    CE , Dr. Moishe Spice    FAMHx:  Family History  Problem  Relation Age of Onset  . Hypertension Mother   . Hypertension Father     SOCHx:   reports that he has been smoking Cigarettes.  He has a 100 pack-year smoking history. He has never used smokeless tobacco. He reports that he does not drink alcohol or use illicit drugs.  ALLERGIES:  Allergies  Allergen Reactions  . Aleve [Naproxen Sodium] Anaphylaxis and Shortness Of Breath  . Penicillins Hives and Itching  . Sulfa Antibiotics Hives    ROS: A comprehensive review of systems was negative except for: Constitutional: positive for fatigue  HOME MEDS: Current  Outpatient Prescriptions  Medication Sig Dispense Refill  . ALPRAZolam (XANAX) 0.5 MG tablet Take 0.5 mg by mouth at bedtime as needed for anxiety.     Marland Kitchen. aspirin 81 MG tablet Take 81 mg by mouth daily.    . betamethasone valerate (VALISONE) 0.1 % cream Apply 1 application topically daily as needed (rash).     . Cyanocobalamin (VITAMIN B-12 PO) Take 1 tablet by mouth daily.    Marland Kitchen. ibuprofen (ADVIL,MOTRIN) 200 MG tablet Take 400 mg by mouth every 6 (six) hours as needed for headache (headache).    . nitroGLYCERIN (NITROSTAT) 0.4 MG SL tablet Place 0.4 mg under the tongue every 5 (five) minutes as needed for chest pain.    Marland Kitchen. oxyCODONE-acetaminophen (ROXICET) 5-325 MG per tablet Take 1-2 tablets by mouth every 4 (four) hours as needed for moderate pain or severe pain. After kidney stone treatment. 30 tablet 0  . phenylephrine (NEO-SYNEPHRINE) 1 % nasal spray Place 1 drop into the nose 3 (three) times daily.    . pravastatin (PRAVACHOL) 40 MG tablet TAKE 1 TABLET BY MOUTH ONCE DAILY IN THE EVENING 90 tablet 3   No current facility-administered medications for this visit.    LABS/IMAGING: No results found for this or any previous visit (from the past 48 hour(s)). No results found.  VITALS: BP 114/80 mmHg  Pulse 51  Ht 6' (1.829 m)  Wt 170 lb 8 oz (77.338 kg)  BMI 23.12 kg/m2  EXAM: General appearance: alert and no distress Neck: no carotid bruit and no JVD Lungs: clear to auscultation bilaterally Heart: regular rate and rhythm, S1, S2 normal, no murmur, click, rub or gallop Abdomen: soft, non-tender; bowel sounds normal; no masses,  no organomegaly Extremities: extremities normal, atraumatic, no cyanosis or edema Pulses: 2+ and symmetric Skin: Skin color, texture, turgor normal. No rashes or lesions or tanned Neurologic: Alert and oriented X 3, normal strength and tone. Normal symmetric reflexes. Normal coordination and gait Psych: Pleasant, normal  EKG: Sinus bradycardia at  51  ASSESSMENT: 1. Paroxysmal atrial fibrillation - amiodarone d/c'd due to abnormal DLCO 2. Chronic anticoagulation- CHADS2VASC score of 2 (patient took himself off of Xarelto due to nosebleeds) 3. Dyslipidemia-intolerance to atorvastatin 4. History of carotid artery disease status post left carotid endarterectomy 5. CAD with moderate disease, non-obstructive 6. Asymptomatic bradycardia  PLAN: 1.   Mr. Onnie BoerMcFarling is doing well and this had no recurrence of his atrial fibrillation. He is currently on aspirin although his risk score for stroke would indicate he should be on warfarin or an alternative anticoagulant. He is not interested in that due to nosebleeds. He understands that there is an increased risk of stroke. Fortunately he was symptomatic with his A. fib. I've advised him to contact me if he should have any further A. fib as he wishes to remain on aspirin for the time being. He does have a history  of dyslipidemia however his last cholesterol profile looks good, despite being intolerant to atorvastatin, he managed to take pravastatin without difficulty.  Plan to see him back in 6 months.  Chrystie Nose, MD, [facility] Attending Cardiologist [facility]  Damonique Brunelle C 02/05/2015, 3:17 PM

## 2015-02-05 NOTE — Patient Instructions (Addendum)
Your physician wants you to follow-up in: 6 months with Dr. Rennis GoldenHilty. You will receive a reminder letter in the mail two months in advance. If you don't receive a letter, please call our office to schedule the follow-up appointment.  Please have fasting labs a few weeks prior to your office visit with Dr. Rennis GoldenHilty

## 2015-03-17 IMAGING — CT CT ABD-PELV W/ CM
2 of 5 series · 16 of 46 positions shown, 18 images · IV contrast (Omnipaque 300)
Comparison: 02/25/2014

CLINICAL DATA: Right lower quadrant abdominal pain.

EXAM:
CT ABDOMEN AND PELVIS WITH CONTRAST
TECHNIQUE: Multidetector CT imaging of the abdomen and pelvis was performed
using the standard protocol following bolus administration of
intravenous contrast.
CONTRAST:  100mL OMNIPAQUE IOHEXOL 300 MG/ML  SOLN

[Series 2: abd_pel_with 5.0 b40f · axial · 0.71mm/px · z∈[+628,+1018]mm · 13 of 88 slices shown, 15 images]
[im 5/88  soft-tissue]
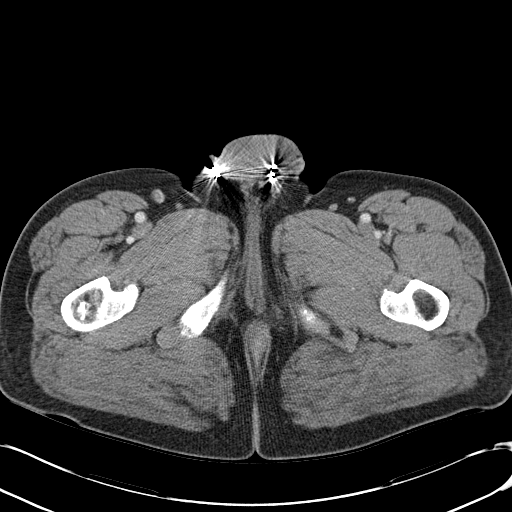
[im 5/88  bone]
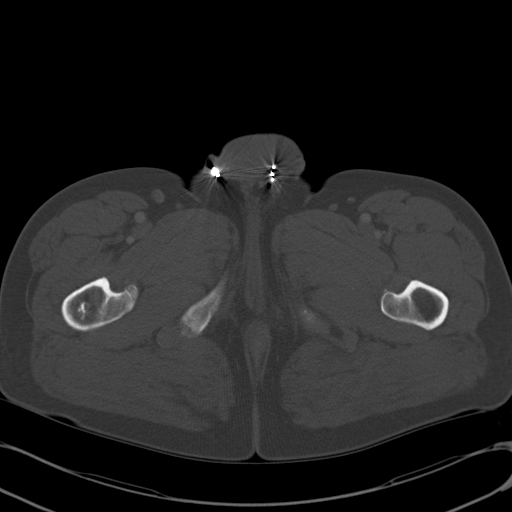
[im 10/88  soft-tissue]
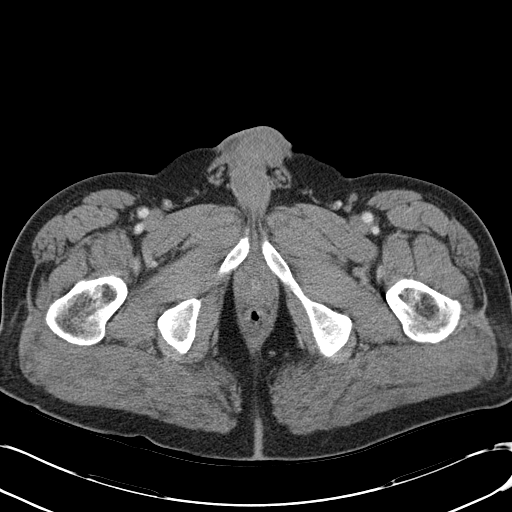
[im 20/88  soft-tissue]
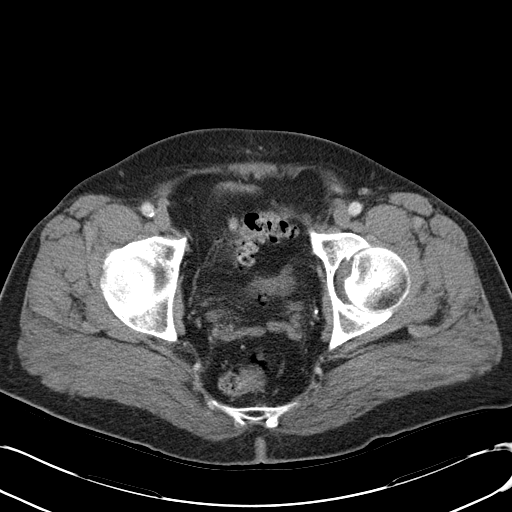
[im 25/88  soft-tissue]
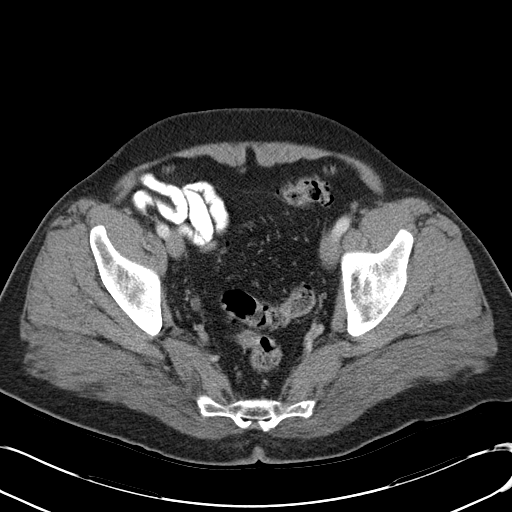
[im 30/88  soft-tissue]
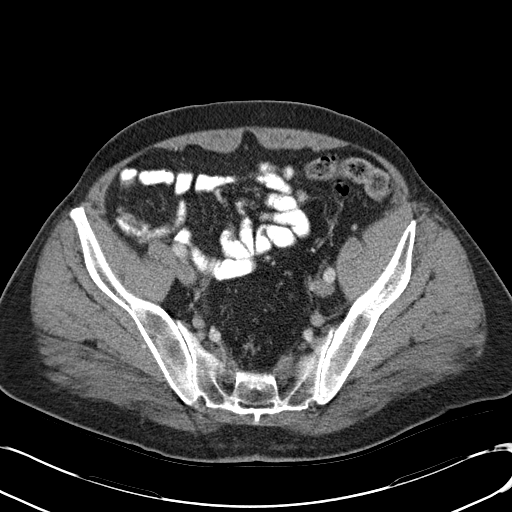
[im 39/88  soft-tissue]
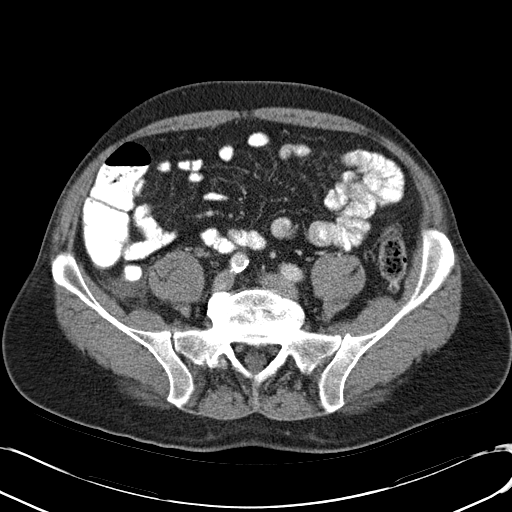
[im 44/88  soft-tissue]
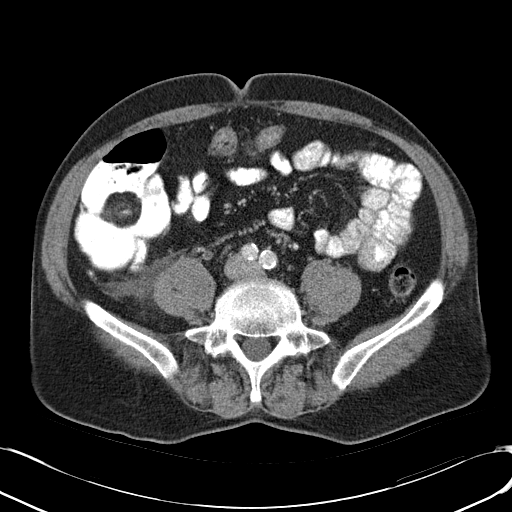
[im 49/88  soft-tissue]
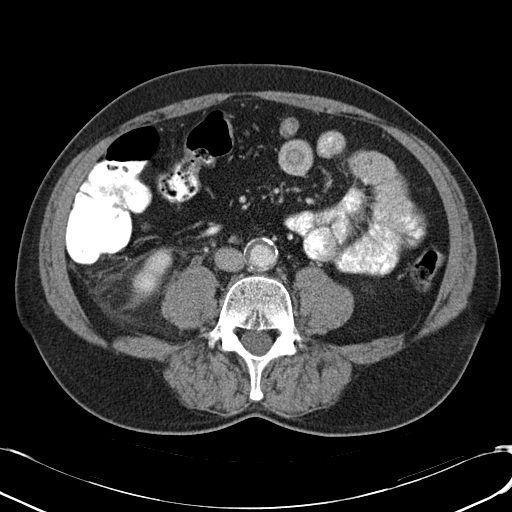
[im 59/88  soft-tissue]
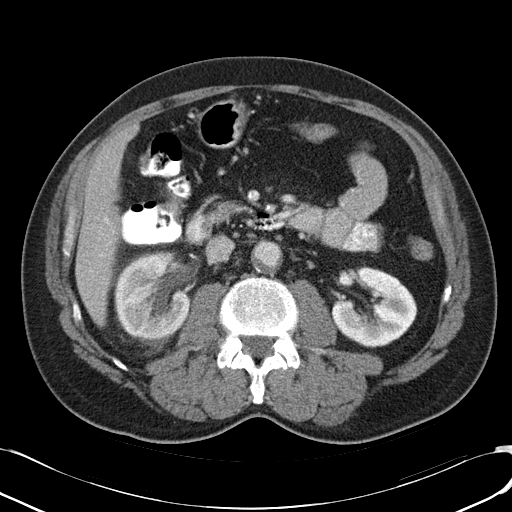
[im 59/88  bone]
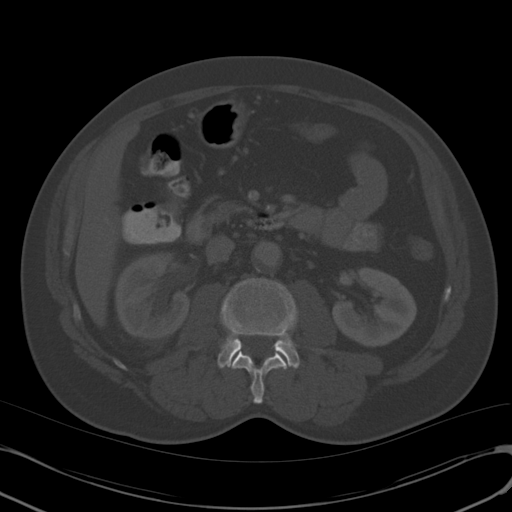
[im 63/88  soft-tissue]
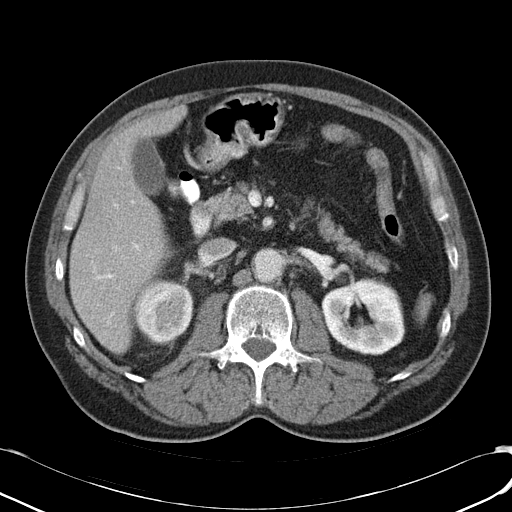
[im 68/88  soft-tissue]
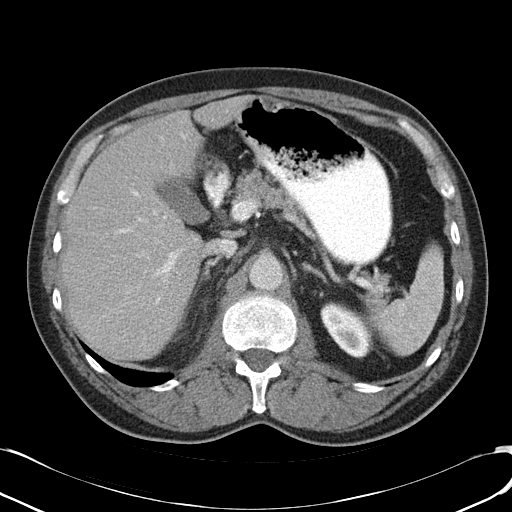
[im 78/88  soft-tissue]
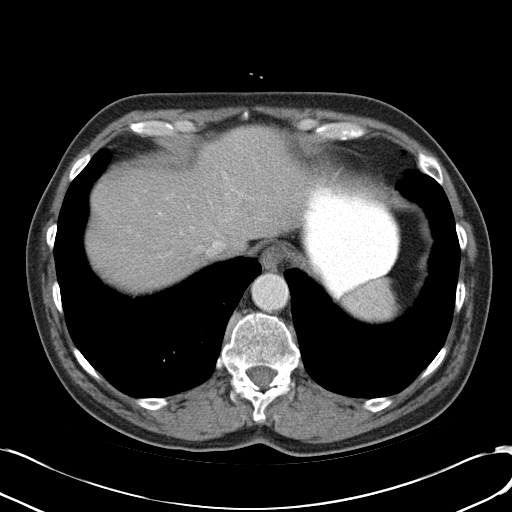
[im 83/88  soft-tissue]
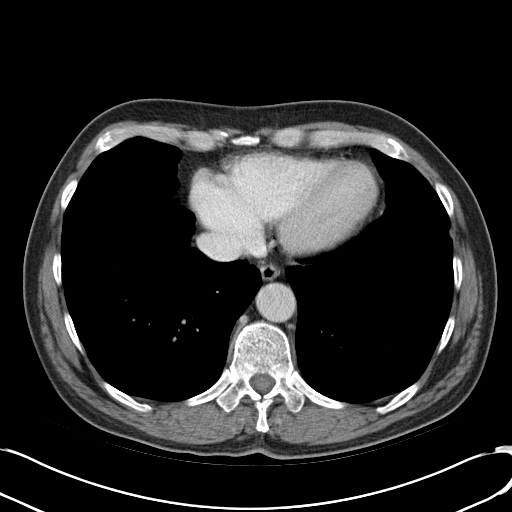

[Series 3: abd_pel_with 3.0 spo cor · coronal · 0.74mm/px · 3 of 88 slices shown]
[im 30/88  soft-tissue]
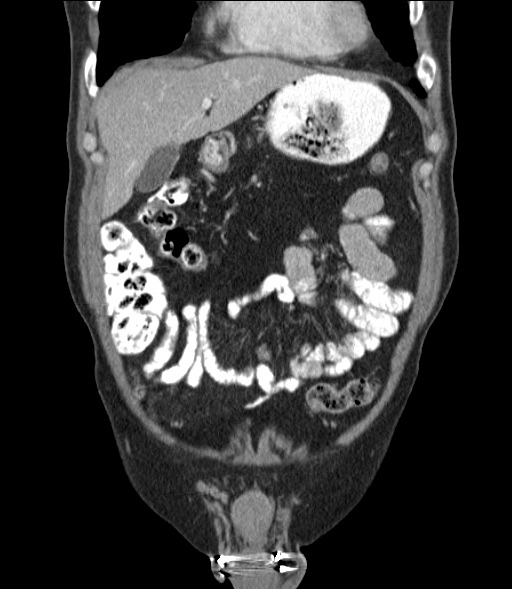
[im 39/88  soft-tissue]
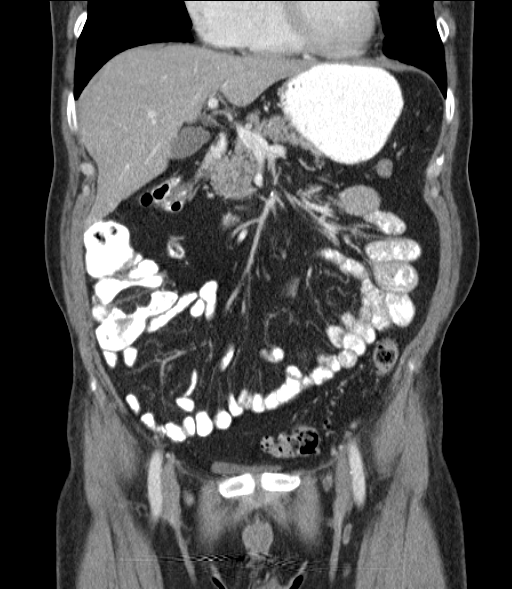
[im 49/88  soft-tissue]
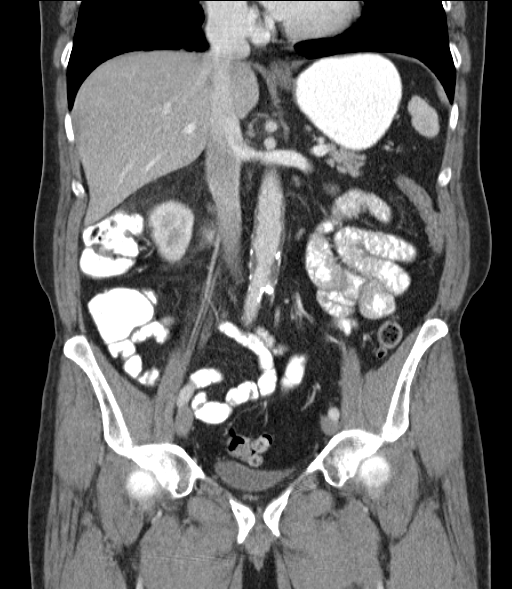

[16 of 46 positions shown; findings below may reference images not displayed]

FINDINGS: BODY WALL: Unremarkable.

LOWER CHEST: No interval growth of a subpleural nodule along the
lower right major fissure. Dedicated chest CT has been acquired
02/25/2014. Centrilobular emphysema in the lower lobes.

ABDOMEN/PELVIS:

Liver: Negative.

Biliary: No evidence of biliary obstruction or stone.

Pancreas: Unremarkable.

Spleen: Unremarkable.

Adrenals: Unremarkable.

Kidneys and ureters: Right hydronephrosis and extensive perinephric
edema secondary to a 6 x 4 mm stone at the right ureteral vesicular
junction. There is also delayed right urinary excretion. Punctate
interpolar calculus on the right. Previously seen left upper pole
calculus is currently not visible. No left hydronephrosis.

Bladder: Unremarkable.

Reproductive: Vasectomy clips.

Bowel: Extensive distal colonic diverticulosis. No evidence of bowel
obstruction or inflammation. There is a 2 cm long enteroenteric
intussusception which is likely incidental and transient. No
intussusception seen on recent abdominal CT. No causative mass
lesion identified. Negative appendix appearing

Retroperitoneum: No mass or adenopathy.

Peritoneum: No ascites or pneumoperitoneum.

Vascular: Extensive atherosclerosis for age with irregular calcified
and noncalcified plaque throughout the abdominal aorta. Mild ectasia
of the infrarenal aorta, 2.4 cm in maximal diameter.

OSSEOUS: No acute abnormalities.
IMPRESSION: 1. Obstructing 6 x 4 mm stone at the right UVJ.
2. Nonobstructive, short enteroenteric intussusception. This is
usually transient/incidental.
3. Chronic/incidental findings are stable from prior and discussed
above.

## 2015-05-07 ENCOUNTER — Other Ambulatory Visit: Payer: Self-pay | Admitting: Internal Medicine

## 2015-05-08 NOTE — Telephone Encounter (Signed)
Rx(s) sent to pharmacy electronically.  

## 2015-07-22 DIAGNOSIS — E785 Hyperlipidemia, unspecified: Secondary | ICD-10-CM | POA: Diagnosis not present

## 2015-07-23 ENCOUNTER — Encounter: Payer: Self-pay | Admitting: *Deleted

## 2015-07-23 LAB — LIPID PANEL
Chol/HDL Ratio: 3.5 ratio units (ref 0.0–5.0)
Cholesterol, Total: 151 mg/dL (ref 100–199)
HDL: 43 mg/dL (ref >39)
LDL CALC: 94 mg/dL (ref 0–99)
TRIGLYCERIDES: 72 mg/dL (ref 0–149)
VLDL Cholesterol Cal: 14 mg/dL (ref 5–40)

## 2015-08-13 ENCOUNTER — Encounter: Payer: Self-pay | Admitting: Internal Medicine

## 2015-08-13 ENCOUNTER — Ambulatory Visit (INDEPENDENT_AMBULATORY_CARE_PROVIDER_SITE_OTHER): Payer: Medicare Other | Admitting: Internal Medicine

## 2015-08-13 VITALS — BP 116/86 | HR 52 | Ht 69.0 in | Wt 167.3 lb

## 2015-08-13 DIAGNOSIS — R911 Solitary pulmonary nodule: Secondary | ICD-10-CM

## 2015-08-13 DIAGNOSIS — Z720 Tobacco use: Secondary | ICD-10-CM

## 2015-08-13 DIAGNOSIS — I2583 Coronary atherosclerosis due to lipid rich plaque: Principal | ICD-10-CM

## 2015-08-13 DIAGNOSIS — I480 Paroxysmal atrial fibrillation: Secondary | ICD-10-CM | POA: Diagnosis not present

## 2015-08-13 DIAGNOSIS — I6523 Occlusion and stenosis of bilateral carotid arteries: Secondary | ICD-10-CM | POA: Diagnosis not present

## 2015-08-13 DIAGNOSIS — I2510 Atherosclerotic heart disease of native coronary artery without angina pectoris: Secondary | ICD-10-CM

## 2015-08-13 DIAGNOSIS — R918 Other nonspecific abnormal finding of lung field: Secondary | ICD-10-CM | POA: Insufficient documentation

## 2015-08-13 NOTE — Progress Notes (Signed)
OFFICE NOTE  Chief Complaint:  Routine follow-up  Primary Care Physician: Colette Ribas, MD  HPI:  Shawn [name] is a pleasant 69 year old gentleman with a history of atrial flutter with rapid ventricular response. He converted to sinus with 2 occurrences. He was seen by EP and recommended amiodarone, which he had been tolerating well. At his last visit, I decreased that. He has also been on Xarelto with some nose bleeding; however, that has improved. He had been using Afrin and I have prescribed him for a Nasonex spray, which he alternated to try to get him off of the aspirin, which most likely was causing a vasomotor rhinitis. He has had no further episodes of epistaxis. He is doing fairly well on amiodarone; however, today we discussed the possible long-term toxicity of this medicine and how it would be more ideal for him to not be on it, given his younger age.  At his last office visit, we discussed possibly changing him off of amiodarone due to the risk of toxicity. I recommended multifactoral ever he cannot afford this. We also talked about changing to Xarelto however that was too expensive for him. He is currently gone back to amiodarone and aspirin. He does not have any recurrent A. fib that he or I are aware of. Unfortunately continues to smoke.  [name] returns today for followup. He is feeling quite well. He did undergo pulmonary function testing which interestingly showed no evidence of COPD, despite his long smoking history. 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 Based on these findings we discontinued his amiodarone. He is maintaining sinus rhythm today.  I saw Asah back in the office today. Overall he is doing well. He's had no recurrent A. fib that were aware of. [name] He had taken himself off of Xarelto due to nosebleeds. He's currently on aspirin.  His CHADSVASC score is 2, therefore anticoagulation with warfarin or a DOAC is indicated for stroke reduction. We discussed this at length today, but he is concerned about ongoing bleeding risks and wishes to stay on aspirin.  I saw Mr. McFarland back in the office today for follow-up. Overall he is without complaints. He denies any recurrent A. fib in EKG shows sinus bradycardia today. He's only on aspirin, despite a CHADSVASC or up to. This is per his choice as he had significant nosebleeds on anticoagulation and discontinued it. He understands that he may need to go back on anticoagulation if he has recurrent A. fib. He seems to be tolerating pravastatin and his had excellent cholesterol control. I reviewed recent laboratory work from his primary care provider which shows good control. He denies any chest pain or worsening shortness of breath. Unfortunately continues to smoke. I did note that his primary care provider had ordered a CT scan of the chest and abdomen in 2015. This was abnormal and the fact that were scattered subcentimeter pulmonary nodules. No follow-up is occurred, but it was recommended at 6 months for a high risk patient.  PMHx:  Past Medical History  Diagnosis Date  . Hyperlipidemia   . Atrial fibrillation (HCC)     atrial flutter  . Coronary artery disease   . Pneumonia     hx of   . Shortness of breath   . Hypotension 04/11/2012  . SSS (sick sinus syndrome) (HCC) 04/11/2012  . PAD (peripheral artery disease) (HCC)   .  History of nuclear stress test 08/2009    bruce myoview; normal pattern of perfusion; low risk   . Peripheral arterial disease (HCC)   . Kidney stones about 2000, and feb 2015    passed the 2015 stone  . Arthritis     DDD lower spine  . Carotid artery occlusion     Past Surgical History  Procedure Laterality Date  . Cardiac catheterization  08/2009    mod 2 vessel disease (L main 20-30% distal taper, LAD 50% stenosis in prox protion, diagonal branch  60% segmental stenosis, Cfx hsa 60-70% eccentric stenosis in AV groove Cfx)   . Transthoracic echocardiogram  03/2012    EF 55-60%, normal LV systolic function; mild MR; LA mod dilated  . Cystoscopy/retrograde/ureteroscopy/stone extraction with basket  about 2000  . Carotid endarterectomy Left May 21, 2009    CE , Dr. Moishe Spice    FAMHx:  Family History  Problem Relation Age of Onset  . Hypertension Mother   . Hypertension Father     SOCHx:   reports that he has been smoking Cigarettes.  He has a 100 pack-year smoking history. He has never used smokeless tobacco. He reports that he does not drink alcohol or use illicit drugs.  ALLERGIES:  Allergies  Allergen Reactions  . Aleve [Naproxen Sodium] Anaphylaxis and Shortness Of Breath  . Penicillins Hives and Itching  . Sulfa Antibiotics Hives    ROS: A comprehensive review of systems was negative.  HOME MEDS: Current Outpatient Prescriptions  Medication Sig Dispense Refill  . ALPRAZolam (XANAX) 0.5 MG tablet Take 0.5 mg by mouth at bedtime as needed for anxiety.     Marland Kitchen aspirin 81 MG tablet Take 81 mg by mouth daily.    . betamethasone valerate (VALISONE) 0.1 % cream Apply 1 application topically daily as needed (rash).     . Cyanocobalamin (VITAMIN B-12 PO) Take 1 tablet by mouth daily.    Marland Kitchen ibuprofen (ADVIL,MOTRIN) 200 MG tablet Take 400 mg by mouth every 6 (six) hours as needed for headache (headache).    . nitroGLYCERIN (NITROSTAT) 0.4 MG SL tablet Place 0.4 mg under the tongue every 5 (five) minutes as needed for chest pain.    Marland Kitchen oxyCODONE-acetaminophen (ROXICET) 5-325 MG per tablet Take 1-2 tablets by mouth every 4 (four) hours as needed for moderate pain or severe pain. After kidney stone treatment. 30 tablet 0  . phenylephrine (NEO-SYNEPHRINE) 1 % nasal spray Place 1 drop into the nose 3 (three) times daily.    . pravastatin (PRAVACHOL) 40 MG tablet TAKE 1 TABLET BY MOUTH AT BEDTIME FOR CHOLESTEROL 90 tablet 2   No  current facility-administered medications for this visit.    LABS/IMAGING: No results found for this or any previous visit (from the past 48 hour(s)). No results found.  VITALS: BP 116/86 mmHg  Pulse 52  Ht  (1.753 m)  Wt 167 lb 4.8 oz (75.887 kg)  BMI 24.69 kg/m2  EXAM: General appearance: alert and no distress Neck: no carotid bruit and no JVD Lungs: clear to auscultation bilaterally Heart: regular rate and rhythm, S1, S2 normal, no murmur, click, rub or gallop Abdomen: soft, non-tender; bowel sounds normal; no masses,  no organomegaly Extremities: extremities normal, atraumatic, no cyanosis or edema Pulses: 2+ and symmetric Skin: Skin color, texture, turgor normal. No rashes or lesions or tanned Neurologic: Alert and oriented X 3, normal strength and tone. Normal symmetric reflexes. Normal coordination and gait Psych: Pleasant, normal  EKG:  Sinus bradycardia at 52  ASSESSMENT: 1. Paroxysmal atrial fibrillation - amiodarone d/c'd due to abnormal DLCO 2. Chronic anticoagulation- CHADS2VASC score of 2 (patient took himself off of Xarelto due to nosebleeds) 3. Dyslipidemia-intolerance to atorvastatin 4. History of carotid artery disease status post left carotid endarterectomy 5. CAD with moderate disease, non-obstructive 6. Asymptomatic bradycardia 7. Subcentimeter pulmonary nodules in a smoker  PLAN: 1.   Mr. Onnie BoerMcFarling is doing well and this had no recurrence of his atrial fibrillation. He is currently on aspirin although his risk score for stroke would indicate he should be on warfarin or an alternative anticoagulant. He is not interested in that due to nosebleeds. He understands that there is an increased risk of stroke. Fortunately he was symptomatic with his A. fib. I've advised him to contact me if he should have any further A. fib as he wishes to remain on aspirin for the time being. He does have a history of dyslipidemia however his last cholesterol profile looks  good, despite being intolerant to atorvastatin, he managed to take pravastatin without difficulty. Finally, it was noted that he had subcentimeter nodules on his CT scan in 2015 in the chest. Based on this, he should've had a repeat CT scan in 6 months per recommendations. He has not followed up with his primary care provider in over a year. I'm recommending that we repeat a CT scan of the chest now to look for any changes in the size of those pulmonary nodules as he continues to smoke.  Plan to see him back annually or sooner as necessary.  Chrystie NoseKenneth C. Saxon Barich, MD, [facility] Attending Cardiologist [facility]  Chrystie NoseKenneth C Sierra Spargo 08/13/2015, 4:31 PM

## 2015-08-13 NOTE — Patient Instructions (Addendum)
Your physician wants you to follow-up in: 1 year with Dr. Rennis GoldenHilty. You will receive a reminder letter in the mail two months in advance. If you don't receive a letter, please call our office to schedule the follow-up appointment.  Dr. Rennis GoldenHilty has [facility]   If you need a refill on your cardiac medications before your next appointment, please call your pharmacy.

## 2015-08-14 ENCOUNTER — Telehealth: Payer: Self-pay | Admitting: *Deleted

## 2015-08-14 NOTE — Telephone Encounter (Signed)
Spoke with patient regarding CT of chest without contrast that was ordered by Dr. Rennis GoldenHilty.  [facility]  Arrive at 4:30 for registration.  Patient voiced his understanding.

## 2015-08-18 ENCOUNTER — Ambulatory Visit (HOSPITAL_COMMUNITY)
Admission: RE | Admit: 2015-08-18 | Discharge: 2015-08-18 | Disposition: A | Discharge status: Home / Self Care | Payer: Medicare Other | Source: Ambulatory Visit | Attending: Internal Medicine | Admitting: Internal Medicine

## 2015-08-18 DIAGNOSIS — R911 Solitary pulmonary nodule: Secondary | ICD-10-CM

## 2015-08-18 DIAGNOSIS — R918 Other nonspecific abnormal finding of lung field: Secondary | ICD-10-CM | POA: Diagnosis not present

## 2015-08-18 DIAGNOSIS — I2510 Atherosclerotic heart disease of native coronary artery without angina pectoris: Secondary | ICD-10-CM | POA: Diagnosis not present

## 2015-08-18 DIAGNOSIS — Z87891 Personal history of nicotine dependence: Secondary | ICD-10-CM | POA: Insufficient documentation

## 2015-08-18 DIAGNOSIS — J439 Emphysema, unspecified: Secondary | ICD-10-CM | POA: Diagnosis not present

## 2015-10-02 DIAGNOSIS — Z0000 Encounter for general adult medical examination without abnormal findings: Secondary | ICD-10-CM | POA: Diagnosis not present

## 2015-10-02 DIAGNOSIS — Z6824 Body mass index (BMI) 24.0-24.9, adult: Secondary | ICD-10-CM | POA: Diagnosis not present

## 2015-10-02 DIAGNOSIS — Z1389 Encounter for screening for other disorder: Secondary | ICD-10-CM | POA: Diagnosis not present

## 2015-10-02 DIAGNOSIS — E782 Mixed hyperlipidemia: Secondary | ICD-10-CM | POA: Diagnosis not present

## 2015-12-18 ENCOUNTER — Encounter: Payer: Self-pay | Admitting: Family

## 2015-12-24 ENCOUNTER — Other Ambulatory Visit: Payer: Self-pay | Admitting: *Deleted

## 2015-12-24 DIAGNOSIS — Z9889 Other specified postprocedural states: Secondary | ICD-10-CM

## 2015-12-24 DIAGNOSIS — I6523 Occlusion and stenosis of bilateral carotid arteries: Secondary | ICD-10-CM

## 2015-12-25 ENCOUNTER — Ambulatory Visit (HOSPITAL_COMMUNITY)
Admission: RE | Admit: 2015-12-25 | Discharge: 2015-12-25 | Disposition: A | Discharge status: Home / Self Care | Payer: Medicare Other | Source: Ambulatory Visit | Attending: Family | Admitting: Family

## 2015-12-25 ENCOUNTER — Encounter: Payer: Self-pay | Admitting: Family

## 2015-12-25 ENCOUNTER — Ambulatory Visit (INDEPENDENT_AMBULATORY_CARE_PROVIDER_SITE_OTHER): Payer: Medicare Other | Admitting: Family

## 2015-12-25 VITALS — BP 126/80 | HR 54 | Ht 69.0 in | Wt 176.8 lb

## 2015-12-25 DIAGNOSIS — E785 Hyperlipidemia, unspecified: Secondary | ICD-10-CM | POA: Insufficient documentation

## 2015-12-25 DIAGNOSIS — I6523 Occlusion and stenosis of bilateral carotid arteries: Secondary | ICD-10-CM

## 2015-12-25 DIAGNOSIS — F17200 Nicotine dependence, unspecified, uncomplicated: Secondary | ICD-10-CM

## 2015-12-25 DIAGNOSIS — Z9889 Other specified postprocedural states: Secondary | ICD-10-CM

## 2015-12-25 DIAGNOSIS — Z48812 Encounter for surgical aftercare following surgery on the circulatory system: Secondary | ICD-10-CM | POA: Diagnosis not present

## 2015-12-25 DIAGNOSIS — Z720 Tobacco use: Secondary | ICD-10-CM

## 2015-12-25 NOTE — Patient Instructions (Addendum)
Stroke Prevention Some medical conditions and behaviors are associated with an increased chance of having a stroke. You may prevent a stroke by making healthy choices and managing medical conditions. HOW CAN I REDUCE MY RISK OF HAVING A STROKE?   Stay physically active. Get at least 30 minutes of activity on most or all days.  Do not smoke. It may also be helpful to avoid exposure to secondhand smoke.  Limit alcohol use. Moderate alcohol use is considered to be:  No more than 2 drinks per day for men.  No more than 1 drink per day for nonpregnant women.  Eat healthy foods. This involves:  Eating 5 or more servings of fruits and vegetables a day.  Making dietary changes that address high blood pressure (hypertension), high cholesterol, diabetes, or obesity.  Manage your cholesterol levels.  Making food choices that are high in fiber and low in saturated fat, trans fat, and cholesterol may control cholesterol levels.  Take any prescribed medicines to control cholesterol as directed by your health care provider.  Manage your diabetes.  Controlling your carbohydrate and sugar intake is recommended to manage diabetes.  Take any prescribed medicines to control diabetes as directed by your health care provider.  Control your hypertension.  Making food choices that are low in salt (sodium), saturated fat, trans fat, and cholesterol is recommended to manage hypertension.  Ask your health care provider if you need treatment to lower your blood pressure. Take any prescribed medicines to control hypertension as directed by your health care provider.  If you are 18-39 years of age, have your blood pressure checked every 3-5 years. If you are 40 years of age or older, have your blood pressure checked every year.  Maintain a healthy weight.  Reducing calorie intake and making food choices that are low in sodium, saturated fat, trans fat, and cholesterol are recommended to manage  weight.  Stop drug abuse.  Avoid taking birth control pills.  Talk to your health care provider about the risks of taking birth control pills if you are over 35 years old, smoke, get migraines, or have ever had a blood clot.  Get evaluated for sleep disorders (sleep apnea).  Talk to your health care provider about getting a sleep evaluation if you snore a lot or have excessive sleepiness.  Take medicines only as directed by your health care provider.  For some people, aspirin or blood thinners (anticoagulants) are helpful in reducing the risk of forming abnormal blood clots that can lead to stroke. If you have the irregular heart rhythm of atrial fibrillation, you should be on a blood thinner unless there is a good reason you cannot take them.  Understand all your medicine instructions.  Make sure that other conditions (such as anemia or atherosclerosis) are addressed. SEEK IMMEDIATE MEDICAL CARE IF:   You have sudden weakness or numbness of the face, arm, or leg, especially on one side of the body.  Your face or eyelid droops to one side.  You have sudden confusion.  You have trouble speaking (aphasia) or understanding.  You have sudden trouble seeing in one or both eyes.  You have sudden trouble walking.  You have dizziness.  You have a loss of balance or coordination.  You have a sudden, severe headache with no known cause.  You have new chest pain or an irregular heartbeat. Any of these symptoms may represent a serious problem that is an emergency. Do not wait to see if the symptoms will   go away. Get medical help at once. Call your local emergency services (911 in [place] Do not drive yourself to the hospital.   This information is not intended to replace advice given to you by your health care provider. Make sure you discuss any questions you have with your health care provider.   Document Released: 11/18/2004 Document Revised: 11/01/2014 Document Reviewed:  04/13/2013 Elsevier Interactive Patient Education 2016 Elsevier Inc.    Smoking Cessation, Tips for Success If you are ready to quit smoking, congratulations! You have chosen to help yourself be healthier. Cigarettes bring nicotine, tar, carbon monoxide, and other irritants into your body. Your lungs, heart, and blood vessels will be able to work better without these poisons. There are many different ways to quit smoking. Nicotine gum, nicotine patches, a nicotine inhaler, or nicotine nasal spray can help with physical craving. Hypnosis, support groups, and medicines help break the habit of smoking. WHAT THINGS CAN I DO TO MAKE QUITTING EASIER?  Here are some tips to help you quit for good:  Pick a date when you will quit smoking completely. Tell all of your friends and family about your plan to quit on that date.  Do not try to slowly cut down on the number of cigarettes you are smoking. Pick a quit date and quit smoking completely starting on that day.  Throw away all cigarettes.   Clean and remove all ashtrays from your home, work, and car.  On a card, write down your reasons for quitting. Carry the card with you and read it when you get the urge to smoke.  Cleanse your body of nicotine. Drink enough water and fluids to keep your urine clear or pale yellow. Do this after quitting to flush the nicotine from your body.  Learn to predict your moods. Do not let a bad situation be your excuse to have a cigarette. Some situations in your life might tempt you into wanting a cigarette.  Never have "just one" cigarette. It leads to wanting another and another. Remind yourself of your decision to quit.  Change habits associated with smoking. If you smoked while driving or when feeling stressed, try other activities to replace smoking. Stand up when drinking your coffee. Brush your teeth after eating. Sit in a different chair when you read the paper. Avoid alcohol while trying to quit, and try to  drink fewer caffeinated beverages. Alcohol and caffeine may urge you to smoke.  Avoid foods and drinks that can trigger a desire to smoke, such as sugary or spicy foods and alcohol.  Ask people who smoke not to smoke around you.  Have something planned to do right after eating or having a cup of coffee. For example, plan to take a walk or exercise.  Try a relaxation exercise to calm you down and decrease your stress. Remember, you may be tense and nervous for the first 2 weeks after you quit, but this will pass.  Find new activities to keep your hands busy. Play with a pen, coin, or rubber band. Doodle or draw things on paper.  Brush your teeth right after eating. This will help cut down on the craving for the taste of tobacco after meals. You can also try mouthwash.   Use oral substitutes in place of cigarettes. Try using lemon drops, carrots, cinnamon sticks, or chewing gum. Keep them handy so they are available when you have the urge to smoke.  When you have the urge to smoke, try deep breathing.    Designate your home as a nonsmoking area.  If you are a heavy smoker, ask your health care provider about a prescription for nicotine chewing gum. It can ease your withdrawal from nicotine.  Reward yourself. Set aside the cigarette money you save and buy yourself something nice.  Look for support from others. Join a support group or smoking cessation program. Ask someone at home or at work to help you with your plan to quit smoking.  Always ask yourself, "Do I need this cigarette or is this just a reflex?" Tell yourself, "Today, I choose not to smoke," or "I do not want to smoke." You are reminding yourself of your decision to quit.  Do not replace cigarette smoking with electronic cigarettes (commonly called e-cigarettes). The safety of e-cigarettes is unknown, and some may contain harmful chemicals.  If you relapse, do not give up! Plan ahead and think about what you will do the next  time you get the urge to smoke. HOW WILL I FEEL WHEN I QUIT SMOKING? You may have symptoms of withdrawal because your body is used to nicotine (the addictive substance in cigarettes). You may crave cigarettes, be irritable, feel very hungry, cough often, get headaches, or have difficulty concentrating. The withdrawal symptoms are only temporary. They are strongest when you first quit but will go away within 10-14 days. When withdrawal symptoms occur, stay in control. Think about your reasons for quitting. Remind yourself that these are signs that your body is healing and getting used to being without cigarettes. Remember that withdrawal symptoms are easier to treat than the major diseases that smoking can cause.  Even after the withdrawal is over, expect periodic urges to smoke. However, these cravings are generally short lived and will go away whether you smoke or not. Do not smoke! WHAT RESOURCES ARE AVAILABLE TO HELP ME QUIT SMOKING? Your health care provider can direct you to community resources or hospitals for support, which may include:  Group support.  Education.  Hypnosis.  Therapy.   This information is not intended to replace advice given to you by your health care provider. Make sure you discuss any questions you have with your health care provider.   Document Released: 07/09/2004 Document Revised: 11/01/2014 Document Reviewed: 03/29/2013 Elsevier Interactive Patient Education 2016 Else[employer] Inc.   Steps to Quit Smoking  Smoking tobacco can be harmful to your health and can affect almost every organ in your body. Smoking puts you, and those around you, at risk for developing many serious chronic diseases. Quitting smoking is difficult, but it is one of the best things that you can do for your health. It is never too late to quit. WHAT ARE THE BENEFITS OF QUITTING SMOKING? When you quit smoking, you lower your risk of developing serious diseases and conditions, such as:  Lung  cancer or lung disease, such as COPD.  Heart disease.  Stroke.  Heart attack.  Infertility.  Osteoporosis and bone fractures. Additionally, symptoms such as coughing, wheezing, and shortness of breath may get better when you quit. You may also find that you get sick less often because your body is stronger at fighting off colds and infections. If you are pregnant, quitting smoking can help to reduce your chances of having a baby of low birth weight. HOW DO I GET READY TO QUIT? When you decide to quit smoking, create a plan to make sure that you are successful. Before you quit:  Pick a date to quit. Set a date within the   next two weeks to give you time to prepare.  Write down the reasons why you are quitting. Keep this list in places where you will see it often, such as on your bathroom mirror or in your car or wallet.  Identify the people, places, things, and activities that make you want to smoke (triggers) and avoid them. Make sure to take these actions:  Throw away all cigarettes at home, at work, and in your car.  Throw away smoking accessories, such as ashtrays and lighters.  Clean your car and make sure to empty the ashtray.  Clean your home, including curtains and carpets.  Tell your family, friends, and coworkers that you are quitting. Support from your loved ones can make quitting easier.  Talk with your health care provider about your options for quitting smoking.  Find out what treatment options are covered by your health insurance. WHAT STRATEGIES CAN I USE TO QUIT SMOKING?  Talk with your healthcare provider about different strategies to quit smoking. Some strategies include:  Quitting smoking altogether instead of gradually lessening how much you smoke over a period of time. Research shows that quitting "cold turkey" is more successful than gradually quitting.  Attending in-person counseling to help you build problem-solving skills. You are more likely to have  success in quitting if you attend several counseling sessions. Even short sessions of 10 minutes can be effective.  Finding resources and support systems that can help you to quit smoking and remain smoke-free after you quit. These resources are most helpful when you use them often. They can include:  Online chats with a counselor.  Telephone quitlines.  Printed self-help materials.  Support groups or group counseling.  Text messaging programs.  Mobile phone applications.  Taking medicines to help you quit smoking. (If you are pregnant or breastfeeding, talk with your health care provider first.) Some medicines contain nicotine and some do not. Both types of medicines help with cravings, but the medicines that include nicotine help to relieve withdrawal symptoms. Your health care provider may recommend:  Nicotine patches, gum, or lozenges.  Nicotine inhalers or sprays.  Non-nicotine medicine that is taken by mouth. Talk with your health care provider about combining strategies, such as taking medicines while you are also receiving in-person counseling. Using these two strategies together makes you more likely to succeed in quitting than if you used either strategy on its own. If you are pregnant or breastfeeding, talk with your health care provider about finding counseling or other support strategies to quit smoking. Do not take medicine to help you quit smoking unless told to do so by your health care provider. WHAT THINGS CAN I DO TO MAKE IT EASIER TO QUIT? Quitting smoking might feel overwhelming at first, but there is a lot that you can do to make it easier. Take these important actions:  Reach out to your family and friends and ask that they support and encourage you during this time. Call telephone quitlines, reach out to support groups, or work with a counselor for support.  Ask people who smoke to avoid smoking around you.  Avoid places that trigger you to smoke, such as bars,  parties, or smoke-break areas at work.  Spend time around people who do not smoke.  Lessen stress in your life, because stress can be a smoking trigger for some people. To lessen stress, try:  Exercising regularly.  Deep-breathing exercises.  Yoga.  Meditating.  Performing a body scan. This involves closing your eyes, scanning   your body from head to toe, and noticing which parts of your body are particularly tense. Purposefully relax the muscles in those areas.  Download or purchase mobile phone or tablet apps (applications) that can help you stick to your quit plan by providing reminders, tips, and encouragement. There are many free apps, such as QuitGuide from the CDC (Centers for Disease Control and Prevention). You can find other support for quitting smoking (smoking cessation) through smokefree.gov and other websites. HOW WILL I FEEL WHEN I QUIT SMOKING? Within the first 24 hours of quitting smoking, you may start to feel some withdrawal symptoms. These symptoms are usually most noticeable 2-3 days after quitting, but they usually do not last beyond 2-3 weeks. Changes or symptoms that you might experience include:  Mood swings.  Restlessness, anxiety, or irritation.  Difficulty concentrating.  Dizziness.  Strong cravings for sugary foods in addition to nicotine.  Mild weight gain.  Constipation.  Nausea.  Coughing or a sore throat.  Changes in how your medicines work in your body.  A depressed mood.  Difficulty sleeping (insomnia). After the first 2-3 weeks of quitting, you may start to notice more positive results, such as:  Improved sense of smell and taste.  Decreased coughing and sore throat.  Slower heart rate.  Lower blood pressure.  Clearer skin.  The ability to breathe more easily.  Fewer sick days. Quitting smoking is very challenging for most people. Do not get discouraged if you are not successful the first time. Some people need to make many  attempts to quit before they achieve long-term success. Do your best to stick to your quit plan, and talk with your health care provider if you have any questions or concerns.   This information is not intended to replace advice given to you by your health care provider. Make sure you discuss any questions you have with your health care provider.   Document Released: 10/05/2001 Document Revised: 02/25/2015 Document Reviewed: 02/25/2015 Elsevier Interactive Patient Education 2016 Elsevier Inc.   

## 2015-12-25 NOTE — Progress Notes (Signed)
Chief Complaint: Extracranial Carotid Artery Stenosis   History of Present Illness  Shawn [name] is a 70 y.o. male patient of [name] who is status post left CEA in 2010 for 95%+ stenosis. He returns today for follow up. He had a TIA as manifested by transient expressive aphasia just before the 2010 CEA; he denies a history of hemiparesis, amaurosis fugax, or unilateral facial drooping. He denies claudication symptoms with walking. 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 Patient denies New Medical or Surgical History.  Pt Diabetic: No Pt smoker: smoker (2 ppd x 50 yrs)  Pt meds include: Statin : Yes ASA: Yes Other anticoagulants/antiplatelets: no, was taking Xaralto, due to easy bleeding in skin and nose bleeds, stopped by his cardiologist, Dr. Rennis Golden. No atrial flutter since hospitalized for this in 2014    Past Medical History  Diagnosis Date  . Hyperlipidemia   . Atrial fibrillation (HCC)     atrial flutter  . Coronary artery disease   . Pneumonia     hx of   . Shortness of breath   . Hypotension 04/11/2012  . SSS (sick sinus syndrome) (HCC) 04/11/2012  . PAD (peripheral artery disease) (HCC)   . History of nuclear stress test 08/2009    bruce myoview; normal pattern of perfusion; low risk   . Peripheral arterial disease (HCC)   . Kidney stones about 2000, and feb 2015    passed the 2015 stone  . Arthritis     DDD lower spine  . Carotid artery occlusion     Social History Social History  Substance Use Topics  . Smoking status: Current Every Day Smoker -- 2.00 packs/day for 50 years    Types: Cigarettes  . Smokeless tobacco: Never Used     Comment: smokes 1 ppd (02/05/15)  . Alcohol Use: No    Family History Family History  Problem Relation Age of Onset  . Hypertension Mother   . Hypertension Father     Surgical History Past Surgical History  Procedure Laterality Date  . Cardiac catheterization  08/2009   mod 2 vessel disease (L main 20-30% distal taper, LAD 50% stenosis in prox protion, diagonal branch 60% segmental stenosis, Cfx hsa 60-70% eccentric stenosis in AV groove Cfx)   . Transthoracic echocardiogram  03/2012    EF 55-60%, normal LV systolic function; mild MR; LA mod dilated  . Cystoscopy/retrograde/ureteroscopy/stone extraction with basket  about 2000  . Carotid endarterectomy Left May 21, 2009    CE , Dr. Moishe Spice    Allergies  Allergen Reactions  . Aleve [Naproxen Sodium] Anaphylaxis and Shortness Of Breath  . Penicillins Hives and Itching  . Sulfa Antibiotics Hives    Current Outpatient Prescriptions  Medication Sig Dispense Refill  . ALPRAZolam (XANAX) 0.5 MG tablet Take 0.5 mg by mouth at bedtime as needed for anxiety.     Marland Kitchen aspirin 81 MG tablet Take 81 mg by mouth daily.    . betamethasone valerate (VALISONE) 0.1 % cream Apply 1 application topically daily as needed (rash).     . Cyanocobalamin (VITAMIN B-12 PO) Take 1 tablet by mouth daily.    Marland Kitchen ibuprofen (ADVIL,MOTRIN) 200 MG tablet Take 400 mg by mouth every 6 (six) hours as needed for headache (headache).    . nitroGLYCERIN (NITROSTAT) 0.4 MG SL tablet Place 0.4 mg under the tongue every 5 (five) minutes as needed for chest pain.    Marland Kitchen  oxyCODONE-acetaminophen (ROXICET) 5-325 MG per tablet Take 1-2 tablets by mouth every 4 (four) hours as needed for moderate pain or severe pain. After kidney stone treatment. 30 tablet 0  . phenylephrine (NEO-SYNEPHRINE) 1 % nasal spray Place 1 drop into the nose 3 (three) times daily.    . pravastatin (PRAVACHOL) 40 MG tablet TAKE 1 TABLET BY MOUTH AT BEDTIME FOR CHOLESTEROL 90 tablet 2   No current facility-administered medications for this visit.    Review of Systems : See HPI for pertinent positives and negatives.  Physical Examination  Filed Vitals:   12/25/15 1452 12/25/15 1454  BP: 129/81 126/80  Pulse: 54   Height:  (1.753 m)   Weight: 176 lb 12.8 oz (80.196  kg)   SpO2: 98%    Body mass index is 26.1 kg/(m^2).   General: WDWN male in NAD GAIT: normal Eyes: PERRLA Pulmonary: Non-labored, CTAB, diminished air movement in all fields Cardiac: sinus bradycardia rhythm, no detected murmur.  VASCULAR EXAM Carotid Bruits Left Right   Negative Negative   Radial pulses are 2+ palpable and equal.      LE Pulses LEFT RIGHT   POPLITEAL not palpable  not palpable   POSTERIOR TIBIAL  palpable   palpable     Gastrointestinal: soft, nontender, BS WNL, no r/g, no palpable masses.  Musculoskeletal: No muscle atrophy/wasting. M/S 5/5 throughout, Extremities without ischemic changes.  Neurologic: A&O X 3; Appropriate Affect, sensation is normal,  Speech is normal CN 2-12 intact, Pain and light touch intact in extremities, Motor exam as listed above.               Non-Invasive Vascular Imaging CAROTID DUPLEX 12/25/2015   Right ICA: no appreciable plaque. Left ICA: no restenosis in the CEA site. Bilateral vertebral artery is antegrade. No significant change compared to prior exam.    Assessment: Shawn [name] is a 70 y.o. male who is status post left CEA in 2010 for 95%+ stenosis. He had a pre-opertive TIA as manifested by expressive aphasia, none subsequently.   Today's carotid duplex suggests no appreciable plaque in the right ICA and no restenosis in the left ICA which is the CEA site. No significant change compared to prior exam.    Fortunately he does not have DM, unfortunately he continues to smoke 2 ppd, see Plan. 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   Plan:  The patient was counseled re smoking cessation and given several free resources re smoking cessation.   Follow-up in 1 year with Carotid Duplex  scan.   I discussed in depth with the patient the nature of atherosclerosis, and emphasized the importance of maximal medical management including strict control of blood pressure, blood glucose, and lipid levels, obtaining regular exercise, and cessation of smoking.  The patient is aware that without maximal medical management the underlying atherosclerotic disease process will progress, limiting the benefit of any interventions. The patient was given information about stroke prevention and what symptoms should prompt the patient to seek immediate medical care. Thank you for allowing [place] to participate in this patient's care.  Charisse March, RN, MSN, FNP-C [facility] Office: [phone]  Clinic Physician: Darrick Penna  12/25/2015 2:50 PM

## 2016-02-09 ENCOUNTER — Other Ambulatory Visit: Payer: Self-pay | Admitting: Internal Medicine

## 2016-02-10 NOTE — Telephone Encounter (Signed)
Rx request sent to pharmacy.  

## 2016-07-26 ENCOUNTER — Ambulatory Visit (INDEPENDENT_AMBULATORY_CARE_PROVIDER_SITE_OTHER): Payer: Medicare Other | Admitting: Internal Medicine

## 2016-07-26 ENCOUNTER — Encounter: Payer: Self-pay | Admitting: Internal Medicine

## 2016-07-26 VITALS — BP 122/84 | HR 52 | Ht 72.0 in | Wt 175.8 lb

## 2016-07-26 DIAGNOSIS — I2583 Coronary atherosclerosis due to lipid rich plaque: Secondary | ICD-10-CM

## 2016-07-26 DIAGNOSIS — I6523 Occlusion and stenosis of bilateral carotid arteries: Secondary | ICD-10-CM | POA: Diagnosis not present

## 2016-07-26 DIAGNOSIS — I2510 Atherosclerotic heart disease of native coronary artery without angina pectoris: Secondary | ICD-10-CM

## 2016-07-26 DIAGNOSIS — Z720 Tobacco use: Secondary | ICD-10-CM | POA: Diagnosis not present

## 2016-07-26 DIAGNOSIS — E785 Hyperlipidemia, unspecified: Secondary | ICD-10-CM | POA: Diagnosis not present

## 2016-07-26 DIAGNOSIS — I480 Paroxysmal atrial fibrillation: Secondary | ICD-10-CM | POA: Diagnosis not present

## 2016-07-26 NOTE — Patient Instructions (Addendum)
Medication Instructions:  Your physician recommends that you continue on your current medications as directed. Please refer to the Current Medication list given to you today.  Labwork: None   Testing/Procedures: None   Follow-Up: Your physician wants you to follow-up in: 212 MONTHS with Dr Rennis GoldenHilty. You will receive a reminder letter in the mail two months in advance. If you don't receive a letter, please call our office to schedule the follow-up appointment.  Any Other Special Instructions Will Be Listed Below (If Applicable).     If you need a refill on your cardiac medications before your next appointment, please call your pharmacy.

## 2016-07-28 NOTE — Progress Notes (Signed)
OFFICE NOTE  Chief Complaint:  Routine follow-up  Primary Care Physician: Colette Ribas, MD  HPI:  Shawn [name] is a pleasant 70 year old gentleman with a history of atrial flutter with rapid ventricular response. He converted to sinus with 2 occurrences. He was seen by EP and recommended amiodarone, which he had been tolerating well. At his last visit, I decreased that. He has also been on Xarelto with some nose bleeding; however, that has improved. He had been using Afrin and I have prescribed him for a Nasonex spray, which he alternated to try to get him off of the aspirin, which most likely was causing a vasomotor rhinitis. He has had no further episodes of epistaxis. He is doing fairly well on amiodarone; however, today we discussed the possible long-term toxicity of this medicine and how it would be more ideal for him to not be on it, given his younger age.  At his last office visit, we discussed possibly changing him off of amiodarone due to the risk of toxicity. I recommended multifactoral ever he cannot afford this. We also talked about changing to Xarelto however that was too expensive for him. He is currently gone back to amiodarone and aspirin. He does not have any recurrent A. fib that he or I are aware of. Unfortunately continues to smoke.  [name] for followup. He is feeling quite well. He did undergo pulmonary function testing which interestingly showed no evidence of COPD, despite his long smoking history. 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 Based on these findings we discontinued his amiodarone. He is maintaining sinus rhythm today.  [name] Overall he is doing well. He's had no recurrent A. fib that were aware of. [name] He had taken himself off of Xarelto due to nosebleeds. He's currently on aspirin.  His CHADSVASC score is 2, therefore anticoagulation with warfarin or a DOAC is indicated for stroke reduction. We discussed this at length today, but he is concerned about ongoing bleeding risks and wishes to stay on aspirin.  [name] Overall he is without complaints. He denies any recurrent A. fib in EKG shows sinus bradycardia today. He's only on aspirin, despite a CHADSVASC or up to. This is per his choice as he had significant nosebleeds on anticoagulation and discontinued it. He understands that he may need to go back on anticoagulation if he has recurrent A. fib. He seems to be tolerating pravastatin and his had excellent cholesterol control. I reviewed recent laboratory work from his primary care provider which shows good control. He denies any chest pain or worsening shortness of breath. Unfortunately continues to smoke. I did note that his primary care provider had ordered a CT scan of the chest and abdomen in 2015. This was abnormal and the fact that were scattered subcentimeter pulmonary nodules. No follow-up is occurred, but it was recommended at 6 months for a high risk patient.  07/26/2016  [name] for follow-up. Overall he feels like he is doing well. He denies any chest pain or worsening shortness of breath. The pressures well-controlled today. EKG shows a sinus bradycardia at 52. He continues to be off of anticoagulation despite a CHADSVASC score of 2. Took himself off of Xarelto due to nosebleeds in the past.  PMHx:  Past  Medical History:  Diagnosis Date  . Arthritis    DDD lower spine  . Atrial fibrillation (HCC)    atrial flutter  . Carotid artery occlusion   . Coronary artery disease   . History of nuclear stress test 08/2009   bruce myoview; normal pattern of perfusion; low risk   . Hyperlipidemia   . Hypotension 04/11/2012  . Kidney stones about 2000, and feb 2015   passed the 2015 stone  . PAD  (peripheral artery disease) (HCC)   . Peripheral arterial disease (HCC)   . Pneumonia    hx of   . Shortness of breath   . SSS (sick sinus syndrome) (HCC) 04/11/2012    Past Surgical History:  Procedure Laterality Date  . CARDIAC CATHETERIZATION  08/2009   mod 2 vessel disease (L main 20-30% distal taper, LAD 50% stenosis in prox protion, diagonal branch 60% segmental stenosis, Cfx hsa 60-70% eccentric stenosis in AV groove Cfx)   . CAROTID ENDARTERECTOMY Left May 21, 2009   CE , Dr. Moishe Spice  . CYSTOSCOPY/RETROGRADE/URETEROSCOPY/STONE EXTRACTION WITH BASKET  about 2000  . TRANSTHORACIC ECHOCARDIOGRAM  03/2012   EF 55-60%, normal LV systolic function; mild MR; LA mod dilated    FAMHx:  Family History  Problem Relation Age of Onset  . Hypertension Mother   . Hypertension Father     SOCHx:   reports that he has been smoking Cigarettes.  He has a 100.00 pack-year smoking history. He has never used smokeless tobacco. He reports that he does not drink alcohol or use drugs.  ALLERGIES:  Allergies  Allergen Reactions  . Aleve [Naproxen Sodium] Anaphylaxis and Shortness Of Breath  . Penicillins Hives and Itching  . Sulfa Antibiotics Hives    ROS: A comprehensive review of systems was negative.  HOME MEDS: Current Outpatient Prescriptions  Medication Sig Dispense Refill  . ALPRAZolam (XANAX) 0.5 MG tablet Take 0.5 mg by mouth at bedtime as needed for anxiety.     Marland Kitchen aspirin 81 MG tablet Take 81 mg by mouth daily.    . betamethasone valerate (VALISONE) 0.1 % cream Apply 1 application topically daily as needed (rash).     . Cyanocobalamin (VITAMIN B-12 PO) Take 1 tablet by mouth daily.    Marland Kitchen ibuprofen (ADVIL,MOTRIN) 200 MG tablet Take 400 mg by mouth every 6 (six) hours as needed for headache (headache).    . oxyCODONE-acetaminophen (ROXICET) 5-325 MG per tablet Take 1-2 tablets by mouth every 4 (four) hours as needed for moderate pain or severe pain. After kidney stone  treatment. 30 tablet 0  . phenylephrine (NEO-SYNEPHRINE) 1 % nasal spray Place 1 drop into the nose 3 (three) times daily.    . pravastatin (PRAVACHOL) 40 MG tablet TAKE 1 TABLET BY MOUTH AT BEDTIME FOR CHOLESTEROL 90 tablet 1   No current facility-administered medications for this visit.     LABS/IMAGING: No results found for this or any previous visit (from the past 48 hour(s)). No results found.  VITALS: BP 122/84   Pulse (!) 52   Ht 6' (1.829 m)   Wt 175 lb 12.8 oz (79.7 kg)   BMI 23.84 kg/m   EXAM: General appearance: alert and no distress Neck: no carotid bruit and no JVD Lungs: clear to auscultation bilaterally Heart: regular rate and rhythm, S1, S2 normal, no murmur, click, rub or gallop Abdomen: soft, non-tender; bowel sounds normal; no masses,  no organomegaly Extremities: extremities normal, atraumatic, no cyanosis or edema Pulses: 2+ and  symmetric Skin: Skin color, texture, turgor normal. No rashes or lesions or tanned Neurologic: Alert and oriented X 3, normal strength and tone. Normal symmetric reflexes. Normal coordination and gait Psych: Pleasant, normal  EKG: Sinus bradycardia at 52  ASSESSMENT: 1. Paroxysmal atrial fibrillation - amiodarone d/c'd due to abnormal DLCO 2. Chronic anticoagulation- CHADS2VASC score of 2 (patient took himself off of Xarelto due to nosebleeds) 3. Dyslipidemia-intolerance to atorvastatin 4. History of carotid artery disease status post left carotid endarterectomy 5. CAD with moderate disease, non-obstructive 6. Asymptomatic bradycardia 7. Subcentimeter pulmonary nodules in a smoker  PLAN: 1.   Shawn [name] is doing well. He is some centimeter pulmonary nodules are stable and I do not suspect relates to lung cancer. That being said he should quit smoking. He has upcoming carotid Dopplers in March in follow-up with vein and vascular surgery. He's not had any recurrent A. fib that he's aware of. Blood pressure is well-controlled.  He is not anticoagulated due to personal history of nosebleeds and his disinterest in anticoagulation. For now continue aspirin although this may not provide adequate protection.  Follow-up annually.  Chrystie NoseKenneth C. Hilty, MD, [facility] SurgeryFACC Attending Cardiologist [facility]  Chrystie NoseKenneth C Hilty 07/28/2016, 5:39 PM

## 2016-10-01 DIAGNOSIS — J209 Acute bronchitis, unspecified: Secondary | ICD-10-CM | POA: Diagnosis not present

## 2016-10-01 DIAGNOSIS — J069 Acute upper respiratory infection, unspecified: Secondary | ICD-10-CM | POA: Diagnosis not present

## 2016-10-13 DIAGNOSIS — J0140 Acute pansinusitis, unspecified: Secondary | ICD-10-CM | POA: Diagnosis not present

## 2016-10-13 DIAGNOSIS — J40 Bronchitis, not specified as acute or chronic: Secondary | ICD-10-CM | POA: Diagnosis not present

## 2016-11-14 ENCOUNTER — Other Ambulatory Visit: Payer: Self-pay | Admitting: Internal Medicine

## 2016-12-20 DIAGNOSIS — Z6824 Body mass index (BMI) 24.0-24.9, adult: Secondary | ICD-10-CM | POA: Diagnosis not present

## 2016-12-20 DIAGNOSIS — Z0000 Encounter for general adult medical examination without abnormal findings: Secondary | ICD-10-CM | POA: Diagnosis not present

## 2016-12-20 DIAGNOSIS — F419 Anxiety disorder, unspecified: Secondary | ICD-10-CM | POA: Diagnosis not present

## 2016-12-20 DIAGNOSIS — Z1389 Encounter for screening for other disorder: Secondary | ICD-10-CM | POA: Diagnosis not present

## 2016-12-20 DIAGNOSIS — Z23 Encounter for immunization: Secondary | ICD-10-CM | POA: Diagnosis not present

## 2016-12-25 DIAGNOSIS — Z1211 Encounter for screening for malignant neoplasm of colon: Secondary | ICD-10-CM | POA: Diagnosis not present

## 2016-12-30 ENCOUNTER — Ambulatory Visit: Payer: Medicare Other | Admitting: Family

## 2016-12-30 ENCOUNTER — Encounter (HOSPITAL_COMMUNITY): Payer: Medicare Other

## 2017-01-31 ENCOUNTER — Encounter: Payer: Self-pay | Admitting: Family

## 2017-02-10 ENCOUNTER — Ambulatory Visit (HOSPITAL_COMMUNITY)
Admission: RE | Admit: 2017-02-10 | Discharge: 2017-02-10 | Disposition: A | Discharge status: Home / Self Care | Payer: Medicare Other | Source: Ambulatory Visit | Attending: Family | Admitting: Family

## 2017-02-10 ENCOUNTER — Encounter: Payer: Self-pay | Admitting: Family

## 2017-02-10 ENCOUNTER — Ambulatory Visit (INDEPENDENT_AMBULATORY_CARE_PROVIDER_SITE_OTHER): Payer: Medicare Other | Admitting: Family

## 2017-02-10 VITALS — BP 122/82 | HR 59 | Temp 97.2°F | Resp 16 | Ht 69.0 in | Wt 171.0 lb

## 2017-02-10 DIAGNOSIS — F17200 Nicotine dependence, unspecified, uncomplicated: Secondary | ICD-10-CM

## 2017-02-10 DIAGNOSIS — Z48812 Encounter for surgical aftercare following surgery on the circulatory system: Secondary | ICD-10-CM | POA: Insufficient documentation

## 2017-02-10 DIAGNOSIS — I6523 Occlusion and stenosis of bilateral carotid arteries: Secondary | ICD-10-CM

## 2017-02-10 DIAGNOSIS — Z9889 Other specified postprocedural states: Secondary | ICD-10-CM | POA: Insufficient documentation

## 2017-02-10 DIAGNOSIS — I6521 Occlusion and stenosis of right carotid artery: Secondary | ICD-10-CM | POA: Insufficient documentation

## 2017-02-10 LAB — VAS US CAROTID
LCCADDIAS: 22 cm/s
LCCADSYS: 86 cm/s
LEFT ECA DIAS: -13 cm/s
LEFT VERTEBRAL DIAS: -16 cm/s
Left CCA prox dias: 26 cm/s
Left CCA prox sys: 94 cm/s
RIGHT CCA MID DIAS: 26 cm/s
RIGHT ECA DIAS: -13 cm/s
RIGHT VERTEBRAL DIAS: -13 cm/s
Right CCA prox dias: 24 cm/s
Right CCA prox sys: 88 cm/s

## 2017-02-10 NOTE — Patient Instructions (Signed)
Stroke Prevention Some medical conditions and behaviors are associated with an increased chance of having a stroke. You may prevent a stroke by making healthy choices and managing medical conditions. How can I reduce my risk of having a stroke?  Stay physically active. Get at least 30 minutes of activity on most or all days.  Do not smoke. It may also be helpful to avoid exposure to secondhand smoke.  Limit alcohol use. Moderate alcohol use is considered to be:  No more than 2 drinks per day for men.  No more than 1 drink per day for nonpregnant women.  Eat healthy foods. This involves:  Eating 5 or more servings of fruits and vegetables a day.  Making dietary changes that address high blood pressure (hypertension), high cholesterol, diabetes, or obesity.  Manage your cholesterol levels.  Making food choices that are high in fiber and low in saturated fat, trans fat, and cholesterol may control cholesterol levels.  Take any prescribed medicines to control cholesterol as directed by your health care provider.  Manage your diabetes.  Controlling your carbohydrate and sugar intake is recommended to manage diabetes.  Take any prescribed medicines to control diabetes as directed by your health care provider.  Control your hypertension.  Making food choices that are low in salt (sodium), saturated fat, trans fat, and cholesterol is recommended to manage hypertension.  Ask your health care provider if you need treatment to lower your blood pressure. Take any prescribed medicines to control hypertension as directed by your health care provider.  If you are 18-39 years of age, have your blood pressure checked every 3-5 years. If you are 40 years of age or older, have your blood pressure checked every year.  Maintain a healthy weight.  Reducing calorie intake and making food choices that are low in sodium, saturated fat, trans fat, and cholesterol are recommended to manage  weight.  Stop drug abuse.  Avoid taking birth control pills.  Talk to your health care provider about the risks of taking birth control pills if you are over 35 years old, smoke, get migraines, or have ever had a blood clot.  Get evaluated for sleep disorders (sleep apnea).  Talk to your health care provider about getting a sleep evaluation if you snore a lot or have excessive sleepiness.  Take medicines only as directed by your health care provider.  For some people, aspirin or blood thinners (anticoagulants) are helpful in reducing the risk of forming abnormal blood clots that can lead to stroke. If you have the irregular heart rhythm of atrial fibrillation, you should be on a blood thinner unless there is a good reason you cannot take them.  Understand all your medicine instructions.  Make sure that other conditions (such as anemia or atherosclerosis) are addressed. Get help right away if:  You have sudden weakness or numbness of the face, arm, or leg, especially on one side of the body.  Your face or eyelid droops to one side.  You have sudden confusion.  You have trouble speaking (aphasia) or understanding.  You have sudden trouble seeing in one or both eyes.  You have sudden trouble walking.  You have dizziness.  You have a loss of balance or coordination.  You have a sudden, severe headache with no known cause.  You have new chest pain or an irregular heartbeat. Any of these symptoms may represent a serious problem that is an emergency. Do not wait to see if the symptoms will go away.   Get medical help at once. Call your local emergency services (911 in [place] Do not drive yourself to the hospital.  This information is not intended to replace advice given to you by your health care provider. Make sure you discuss any questions you have with your health care provider. Document Released: 11/18/2004 Document Revised: 03/18/2016 Document Reviewed: 04/13/2013 Elsevier  Interactive Patient Education  2017 Elsevier Inc.     Steps to Quit Smoking Smoking tobacco can be bad for your health. It can also affect almost every organ in your body. Smoking puts you and people around you at risk for many serious long-lasting (chronic) diseases. Quitting smoking is hard, but it is one of the best things that you can do for your health. It is never too late to quit. What are the benefits of quitting smoking? When you quit smoking, you lower your risk for getting serious diseases and conditions. They can include:  Lung cancer or lung disease.  Heart disease.  Stroke.  Heart attack.  Not being able to have children (infertility).  Weak bones (osteoporosis) and broken bones (fractures). If you have coughing, wheezing, and shortness of breath, those symptoms may get better when you quit. You may also get sick less often. If you are pregnant, quitting smoking can help to lower your chances of having a baby of low birth weight. What can I do to help me quit smoking? Talk with your doctor about what can help you quit smoking. Some things you can do (strategies) include:  Quitting smoking totally, instead of slowly cutting back how much you smoke over a period of time.  Going to in-person counseling. You are more likely to quit if you go to many counseling sessions.  Using resources and support systems, such as:  Online chats with a counselor.  Phone quitlines.  Printed self-help materials.  Support groups or group counseling.  Text messaging programs.  Mobile phone apps or applications.  Taking medicines. Some of these medicines may have nicotine in them. If you are pregnant or breastfeeding, do not take any medicines to quit smoking unless your doctor says it is okay. Talk with your doctor about counseling or other things that can help you. Talk with your doctor about using more than one strategy at the same time, such as taking medicines while you are  also going to in-person counseling. This can help make quitting easier. What things can I do to make it easier to quit? Quitting smoking might feel very hard at first, but there is a lot that you can do to make it easier. Take these steps:  Talk to your family and friends. Ask them to support and encourage you.  Call phone quitlines, reach out to support groups, or work with a counselor.  Ask people who smoke to not smoke around you.  Avoid places that make you want (trigger) to smoke, such as:  Bars.  Parties.  Smoke-break areas at work.  Spend time with people who do not smoke.  Lower the stress in your life. Stress can make you want to smoke. Try these things to help your stress:  Getting regular exercise.  Deep-breathing exercises.  Yoga.  Meditating.  Doing a body scan. To do this, close your eyes, focus on one area of your body at a time from head to toe, and notice which parts of your body are tense. Try to relax the muscles in those areas.  Download or buy apps on your mobile phone or tablet   that can help you stick to your quit plan. There are many free apps, such as QuitGuide from the CDC (Centers for Disease Control and Prevention). You can find more support from smokefree.gov and other websites. This information is not intended to replace advice given to you by your health care provider. Make sure you discuss any questions you have with your health care provider. Document Released: 08/07/2009 Document Revised: 06/08/2016 Document Reviewed: 02/25/2015 Elsevier Interactive Patient Education  2017 Elsevier Inc.  

## 2017-02-10 NOTE — Progress Notes (Signed)
Chief Complaint: Follow up Extracranial Carotid Artery Stenosis   History of Present Illness  Shawn [name] is a 71 y.o. male patient of [name] who is status post left CEA in 2010 for 95%+ stenosis. He returns today for follow up. He had a preoperative TIA as manifested by transient expressive aphasia; he denies a history of hemiparesis, amaurosis fugax, or unilateral facial drooping. He denies any subsequent neurological event.  He denies claudication symptoms with walking. 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  Pt Diabetic: No Pt smoker: smoker (1-2 ppd, started at age 46 or 3 yrs)  Pt meds include: Statin : Yes ASA: Yes Other anticoagulants/antiplatelets: no, was taking Xarelto; due to easy bleeding from skin and nose bleeds, stopped by pt who states he notified, Dr. Rennis Golden. No atrial flutter since hospitalized for this in 2014   Past Medical History:  Diagnosis Date  . Arthritis    DDD lower spine  . Atrial fibrillation (HCC)    atrial flutter  . Carotid artery occlusion   . Coronary artery disease   . History of nuclear stress test 08/2009   bruce myoview; normal pattern of perfusion; low risk   . Hyperlipidemia   . Hypotension 04/11/2012  . Kidney stones about 2000, and feb 2015   passed the 2015 stone  . PAD (peripheral artery disease) (HCC)   . Peripheral arterial disease (HCC)   . Pneumonia    hx of   . Shortness of breath   . SSS (sick sinus syndrome) (HCC) 04/11/2012    Social History Social History  Substance Use Topics  . Smoking status: Current Every Day Smoker    Packs/day: 2.00    Years: 50.00    Types: Cigarettes  . Smokeless tobacco: Never Used     Comment: smokes 1 ppd (02/05/15)  . Alcohol use No    Family History Family History  Problem Relation Age of Onset  . Hypertension Mother   . Hypertension Father     Surgical History Past Surgical History:  Procedure Laterality Date  .  CARDIAC CATHETERIZATION  08/2009   mod 2 vessel disease (L main 20-30% distal taper, LAD 50% stenosis in prox protion, diagonal branch 60% segmental stenosis, Cfx hsa 60-70% eccentric stenosis in AV groove Cfx)   . CAROTID ENDARTERECTOMY Left May 21, 2009   CE , Dr. Moishe Spice  . CYSTOSCOPY/RETROGRADE/URETEROSCOPY/STONE EXTRACTION WITH BASKET  about 2000  . TRANSTHORACIC ECHOCARDIOGRAM  03/2012   EF 55-60%, normal LV systolic function; mild MR; LA mod dilated    Allergies  Allergen Reactions  . Aleve [Naproxen Sodium] Anaphylaxis and Shortness Of Breath  . Penicillins Hives and Itching  . Sulfa Antibiotics Hives    Current Outpatient Prescriptions  Medication Sig Dispense Refill  . aspirin 81 MG tablet Take 81 mg by mouth daily.    . betamethasone valerate (VALISONE) 0.1 % cream Apply 1 application topically daily as needed (rash).     . Cyanocobalamin (VITAMIN B-12 PO) Take 1 tablet by mouth daily.    Marland Kitchen ibuprofen (ADVIL,MOTRIN) 200 MG tablet Take 400 mg by mouth every 6 (six) hours as needed for headache (headache).    . phenylephrine (NEO-SYNEPHRINE) 1 % nasal spray Place 1 drop into the nose 3 (three) times daily.    . pravastatin (PRAVACHOL) 40 MG tablet TAKE 1 TABLET BY MOUTH AT BEDTIME FOR CHOLESTEROL 90 tablet 3  . ALPRAZolam (XANAX) 0.5  MG tablet Take 0.5 mg by mouth at bedtime as needed for anxiety.     Marland Kitchen oxyCODONE-acetaminophen (ROXICET) 5-325 MG per tablet Take 1-2 tablets by mouth every 4 (four) hours as needed for moderate pain or severe pain. After kidney stone treatment. (Patient not taking: Reported on 02/10/2017) 30 tablet 0   No current facility-administered medications for this visit.     Review of Systems : See HPI for pertinent positives and negatives.  Physical Examination  Vitals:   02/10/17 1521 02/10/17 1524  BP: 120/76 122/82  Pulse: (!) 56 (!) 59  Resp: 16   Temp: 97.2 F (36.2 C)   SpO2: 98%   Weight: 171 lb (77.6 kg)   Height:  (1.753 m)     Body mass index is 25.25 kg/m.  General: WDWN male in NAD GAIT: normal Eyes: PERRLA Pulmonary: Respirations are non-labored, CTAB, diminished air movement in all fields Cardiac: Regular rhythm, bradycardic (not taking a beta blocker), no detected murmur.  VASCULAR EXAM Carotid Bruits Left Right   Negative Negative   Radial pulses are 2+ palpable and equal.      LE Pulses LEFT RIGHT   POPLITEAL not palpable  not palpable   POSTERIOR TIBIAL  palpable   palpable     Gastrointestinal: soft, nontender, BS WNL, no r/g, no palpable masses.  Musculoskeletal: No muscle atrophy/wasting. M/S 5/5 throughout, Extremities without ischemic changes.  Neurologic: A&O X 3; Appropriate Affect, sensation is normal,  Speech is normal CN 2-12 intact, Pain and light touch intact in extremities, Motor exam as listed above     Assessment: Shawn [name] is a 71 y.o. male who is status post left CEA in 2010 for 95%+ stenosis. He had a pre-opertive TIA as manifested by expressive aphasia, none subsequently.   Fortunately he does not have DM, unfortunately he continues to smoke 1-2 ppd but has decreased use, see Plan. 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    DATA Today's carotid duplex suggests <40% stenosis in the right ICA and no restenosis in the left ICA which is the CEA site. Bilateral vertebral artery flow is antegrade.  Bilateral subclavian artery waveforms are normal.  No significant change compared to prior exam on 12-25-15.    Plan:  The patient was counseled re smoking cessation and given several free resources re smoking cessation.    Follow-up in 1 year with Carotid Duplex scan.    I discussed in depth with the patient the nature of  atherosclerosis, and emphasized the importance of maximal medical management including strict control of blood pressure, blood glucose, and lipid levels, obtaining regular exercise, and cessation of smoking.  The patient is aware that without maximal medical management the underlying atherosclerotic disease process will progress, limiting the benefit of any interventions. The patient was given information about stroke prevention and what symptoms should prompt the patient to seek immediate medical care. Thank you for allowing [place] to participate in this patient's care.  Charisse March, RN, MSN, FNP-C [facility] Office: [phone]  Clinic Physician: Darrick Penna  02/10/17 3:25 PM

## 2017-02-11 NOTE — Addendum Note (Signed)
Addended by: Burton Apley A on: 02/11/2017 04:45 PM   Modules accepted: Orders

## 2017-07-07 DIAGNOSIS — L814 Other melanin hyperpigmentation: Secondary | ICD-10-CM | POA: Diagnosis not present

## 2017-07-07 DIAGNOSIS — L821 Other seborrheic keratosis: Secondary | ICD-10-CM | POA: Diagnosis not present

## 2017-07-07 DIAGNOSIS — L820 Inflamed seborrheic keratosis: Secondary | ICD-10-CM | POA: Diagnosis not present

## 2017-07-07 DIAGNOSIS — L218 Other seborrheic dermatitis: Secondary | ICD-10-CM | POA: Diagnosis not present

## 2017-08-03 ENCOUNTER — Encounter: Payer: Self-pay | Admitting: Internal Medicine

## 2017-08-03 ENCOUNTER — Ambulatory Visit (INDEPENDENT_AMBULATORY_CARE_PROVIDER_SITE_OTHER): Payer: Medicare Other | Admitting: Internal Medicine

## 2017-08-03 VITALS — BP 132/78 | HR 48 | Ht 72.0 in | Wt 173.6 lb

## 2017-08-03 DIAGNOSIS — E785 Hyperlipidemia, unspecified: Secondary | ICD-10-CM

## 2017-08-03 DIAGNOSIS — I6523 Occlusion and stenosis of bilateral carotid arteries: Secondary | ICD-10-CM | POA: Diagnosis not present

## 2017-08-03 DIAGNOSIS — I480 Paroxysmal atrial fibrillation: Secondary | ICD-10-CM

## 2017-08-03 DIAGNOSIS — Z720 Tobacco use: Secondary | ICD-10-CM

## 2017-08-03 NOTE — Patient Instructions (Signed)
Your physician recommends that you return for lab work FASTING to check cholesterol   Your physician wants you to follow-up in one year with Dr. Rennis Golden. You will receive a reminder letter in the mail two months in advance. If you don't receive a letter, please call our office to schedule the follow-up appointment.

## 2017-08-03 NOTE — Progress Notes (Signed)
OFFICE NOTE  Chief Complaint:  Routine follow-up  Primary Care Physician: Assunta Found, MD  HPI:  Jerin Franzel Gores is a pleasant 71 year old gentleman with a history of atrial flutter with rapid ventricular response. He converted to sinus with 2 occurrences. He was seen by EP and recommended amiodarone, which he had been tolerating well. At his last visit, I decreased that. He has also been on Xarelto with some nose bleeding; however, that has improved. He had been using Afrin and I have prescribed him for a Nasonex spray, which he alternated to try to get him off of the aspirin, which most likely was causing a vasomotor rhinitis. He has had no further episodes of epistaxis. He is doing fairly well on amiodarone; however, today we discussed the possible long-term toxicity of this medicine and how it would be more ideal for him to not be on it, given his younger age.  At his last office visit, we discussed possibly changing him off of amiodarone due to the risk of toxicity. I recommended multifactoral ever he cannot afford this. We also talked about changing to Xarelto however that was too expensive for him. He is currently gone back to amiodarone and aspirin. He does not have any recurrent A. fib that he or I are aware of. Unfortunately continues to smoke.  [name] returns today for followup. He is feeling quite well. He did undergo pulmonary function testing which interestingly showed no evidence of COPD, despite his long smoking history. 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 Based on these findings we discontinued his amiodarone. He is maintaining sinus rhythm today.  I saw Tymier back in the office today. Overall he is doing well. He's had no recurrent A. fib that were aware of. [name] He had taken himself off of Xarelto due to nosebleeds. He's currently on aspirin. His  CHADSVASC score is 2, therefore anticoagulation with warfarin or a DOAC is indicated for stroke reduction. We discussed this at length today, but he is concerned about ongoing bleeding risks and wishes to stay on aspirin.  I saw Mr. McFarland back in the office today for follow-up. Overall he is without complaints. He denies any recurrent A. fib in EKG shows sinus bradycardia today. He's only on aspirin, despite a CHADSVASC or up to. This is per his choice as he had significant nosebleeds on anticoagulation and discontinued it. He understands that he may need to go back on anticoagulation if he has recurrent A. fib. He seems to be tolerating pravastatin and his had excellent cholesterol control. I reviewed recent laboratory work from his primary care provider which shows good control. He denies any chest pain or worsening shortness of breath. Unfortunately continues to smoke. I did note that his primary care provider had ordered a CT scan of the chest and abdomen in 2015. This was abnormal and the fact that were scattered subcentimeter pulmonary nodules. No follow-up is occurred, but it was recommended at 6 months for a high risk patient.  07/26/2016  Mr. Canada returns today for follow-up. Overall he feels like he is doing well. He denies any chest pain or worsening shortness of breath. The pressures well-controlled today. EKG shows a sinus bradycardia at 52. He continues to be off of anticoagulation despite a CHADSVASC score of 2. Took himself off of Xarelto due to nosebleeds in the past.  08/03/2017  Mr. Lund  returns today for follow-up. 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 He denies any worsening shortness of breath or chest pain. He says is cut back his smoking a lot but still smokes about 8-10 cigarettes a day. He has a history of PAF and was on amiodarone however that was discontinued. Recently he's had no recurrent atrial fibrillation and he is aware of. He has a dyslipidemia history is well  and was intolerant to atorvastatin. He's not had any recent lipid testing but is on pravastatin 40 mg. There is a history of moderate coronary artery disease which is nonobstructive. He also has asymptomatic bradycardia which is been stable. Heart rate today is 48.  PMHx:  Past Medical History:  Diagnosis Date  . Arthritis    DDD lower spine  . Atrial fibrillation (HCC)    atrial flutter  . Carotid artery occlusion   . Coronary artery disease   . History of nuclear stress test 08/2009   bruce myoview; normal pattern of perfusion; low risk   . Hyperlipidemia   . Hypotension 04/11/2012  . Kidney stones about 2000, and feb 2015   passed the 2015 stone  . PAD (peripheral artery disease) (HCC)   . Peripheral arterial disease (HCC)   . Pneumonia    hx of   . Shortness of breath   . SSS (sick sinus syndrome) (HCC) 04/11/2012    Past Surgical History:  Procedure Laterality Date  . CARDIAC CATHETERIZATION  08/2009   mod 2 vessel disease (L main 20-30% distal taper, LAD 50% stenosis in prox protion, diagonal branch 60% segmental stenosis, Cfx hsa 60-70% eccentric stenosis in AV groove Cfx)   . CAROTID ENDARTERECTOMY Left May 21, 2009   CE , Dr. Moishe Spice  . CYSTOSCOPY/RETROGRADE/URETEROSCOPY/STONE EXTRACTION WITH BASKET  about 2000  . TRANSTHORACIC ECHOCARDIOGRAM  03/2012   EF 55-60%, normal LV systolic function; mild MR; LA mod dilated    FAMHx:  Family History  Problem Relation Age of Onset  . Hypertension Mother   . Hypertension Father     SOCHx:   reports that he has been smoking Cigarettes.  He has a 100.00 pack-year smoking history. He has never used smokeless tobacco. He reports that he does not drink alcohol or use drugs.  ALLERGIES:  Allergies  Allergen Reactions  . Aleve [Naproxen Sodium] Anaphylaxis and Shortness Of Breath  . Penicillins Hives and Itching  . Sulfa Antibiotics Hives    ROS: Pertinent items noted in HPI and remainder of comprehensive ROS  otherwise negative.  HOME MEDS: Current Outpatient Prescriptions  Medication Sig Dispense Refill  . ALPRAZolam (XANAX) 0.5 MG tablet Take 0.5 mg by mouth at bedtime as needed for anxiety.     Marland Kitchen aspirin 81 MG tablet Take 81 mg by mouth daily.    . betamethasone valerate (VALISONE) 0.1 % cream Apply 1 application topically daily as needed (rash).     . Cyanocobalamin (VITAMIN B-12 PO) Take 1 tablet by mouth daily.    Marland Kitchen ibuprofen (ADVIL,MOTRIN) 200 MG tablet Take 400 mg by mouth every 6 (six) hours as needed for headache (headache).    . oxyCODONE-acetaminophen (ROXICET) 5-325 MG per tablet Take 1-2 tablets by mouth every 4 (four) hours as needed for moderate pain or severe pain. After kidney stone treatment. 30 tablet 0  . phenylephrine (NEO-SYNEPHRINE) 1 % nasal spray Place 1 drop into the nose 3 (three) times daily.    . pravastatin (PRAVACHOL) 40 MG tablet TAKE 1 TABLET BY MOUTH  AT BEDTIME FOR CHOLESTEROL 90 tablet 3   No current facility-administered medications for this visit.     LABS/IMAGING: No results found for this or any previous visit (from the past 48 hour(s)). No results found.  VITALS: BP 132/78   Pulse (!) 48   Ht 6' (1.829 m)   Wt 173 lb 9.6 oz (78.7 kg)   BMI 23.54 kg/m   EXAM: General appearance: alert and no distress Neck: no carotid bruit and no JVD Lungs: clear to auscultation bilaterally Heart: Regular bradycardia Abdomen: soft, non-tender; bowel sounds normal; no masses,  no organomegaly Extremities: extremities normal, atraumatic, no cyanosis or edema Pulses: 2+ and symmetric Skin: Skin color, texture, turgor normal. No rashes or lesions Neurologic: Alert and oriented X 3, normal strength and tone. Normal symmetric reflexes. Normal coordination and gait Psych: Pleasant, normal  EKG: Marked sinus bradycardia with sinus arrhythmia 48-personally reviewed  ASSESSMENT: 1. Paroxysmal atrial fibrillation - amiodarone d/c'd due to abnormal DLCO 2. Chronic  anticoagulation- CHADS2VASC score of 2 (patient took himself off of Xarelto due to nosebleeds) 3. Dyslipidemia-intolerance to atorvastatin 4. History of carotid artery disease status post left carotid endarterectomy 5. CAD with moderate disease, non-obstructive 6. Asymptomatic bradycardia 7. Subcentimeter pulmonary nodules in a smoker  PLAN: 1.   Mr. Boudoin continues to be asymptomatic. He's working as a Nutritional therapist and denies any chest pain or worsening shortness of breath. Is followed by vascular surgery for carotid artery disease. He has a history of coronary disease and is on pravastatin. His last lipid profile was over year ago so we'll repeat that. He's had no recurrent A. fib that he's aware of. Was taken off of amiodarone due to a low DLCO. He is not anticoagulated due to taking himself off of Xarelto for nosebleeds. He understands he is at increased stroke risk but declines additional anticoagulation. No other changes to his meds today. Follow-up with me annually or sooner as necessary.  Chrystie Nose, MD, [facility] Attending Cardiologist [facility]  Lisette Abu Hilty 08/03/2017, 4:12 PM

## 2017-08-04 DIAGNOSIS — E785 Hyperlipidemia, unspecified: Secondary | ICD-10-CM | POA: Diagnosis not present

## 2017-08-05 ENCOUNTER — Encounter: Payer: Self-pay | Admitting: Internal Medicine

## 2017-08-05 LAB — LIPID PANEL
CHOLESTEROL TOTAL: 158 mg/dL (ref 100–199)
Chol/HDL Ratio: 3.3 ratio (ref 0.0–5.0)
HDL: 48 mg/dL (ref >39)
LDL Calculated: 91 mg/dL (ref 0–99)
TRIGLYCERIDES: 93 mg/dL (ref 0–149)
VLDL Cholesterol Cal: 19 mg/dL (ref 5–40)

## 2017-08-25 DIAGNOSIS — M9901 Segmental and somatic dysfunction of cervical region: Secondary | ICD-10-CM | POA: Diagnosis not present

## 2017-08-25 DIAGNOSIS — M5416 Radiculopathy, lumbar region: Secondary | ICD-10-CM | POA: Diagnosis not present

## 2017-08-25 DIAGNOSIS — M9902 Segmental and somatic dysfunction of thoracic region: Secondary | ICD-10-CM | POA: Diagnosis not present

## 2017-08-25 DIAGNOSIS — S161XXA Strain of muscle, fascia and tendon at neck level, initial encounter: Secondary | ICD-10-CM | POA: Diagnosis not present

## 2017-08-29 DIAGNOSIS — M5416 Radiculopathy, lumbar region: Secondary | ICD-10-CM | POA: Diagnosis not present

## 2017-08-29 DIAGNOSIS — M9901 Segmental and somatic dysfunction of cervical region: Secondary | ICD-10-CM | POA: Diagnosis not present

## 2017-08-29 DIAGNOSIS — M9902 Segmental and somatic dysfunction of thoracic region: Secondary | ICD-10-CM | POA: Diagnosis not present

## 2017-08-29 DIAGNOSIS — S161XXA Strain of muscle, fascia and tendon at neck level, initial encounter: Secondary | ICD-10-CM | POA: Diagnosis not present

## 2017-08-31 DIAGNOSIS — M9901 Segmental and somatic dysfunction of cervical region: Secondary | ICD-10-CM | POA: Diagnosis not present

## 2017-08-31 DIAGNOSIS — M9902 Segmental and somatic dysfunction of thoracic region: Secondary | ICD-10-CM | POA: Diagnosis not present

## 2017-08-31 DIAGNOSIS — M5416 Radiculopathy, lumbar region: Secondary | ICD-10-CM | POA: Diagnosis not present

## 2017-08-31 DIAGNOSIS — S161XXA Strain of muscle, fascia and tendon at neck level, initial encounter: Secondary | ICD-10-CM | POA: Diagnosis not present

## 2017-09-01 DIAGNOSIS — M9902 Segmental and somatic dysfunction of thoracic region: Secondary | ICD-10-CM | POA: Diagnosis not present

## 2017-09-01 DIAGNOSIS — S161XXA Strain of muscle, fascia and tendon at neck level, initial encounter: Secondary | ICD-10-CM | POA: Diagnosis not present

## 2017-09-01 DIAGNOSIS — M5416 Radiculopathy, lumbar region: Secondary | ICD-10-CM | POA: Diagnosis not present

## 2017-09-01 DIAGNOSIS — M9901 Segmental and somatic dysfunction of cervical region: Secondary | ICD-10-CM | POA: Diagnosis not present

## 2017-09-05 DIAGNOSIS — M9901 Segmental and somatic dysfunction of cervical region: Secondary | ICD-10-CM | POA: Diagnosis not present

## 2017-09-05 DIAGNOSIS — S161XXA Strain of muscle, fascia and tendon at neck level, initial encounter: Secondary | ICD-10-CM | POA: Diagnosis not present

## 2017-09-05 DIAGNOSIS — M5416 Radiculopathy, lumbar region: Secondary | ICD-10-CM | POA: Diagnosis not present

## 2017-09-05 DIAGNOSIS — M9902 Segmental and somatic dysfunction of thoracic region: Secondary | ICD-10-CM | POA: Diagnosis not present

## 2017-09-07 DIAGNOSIS — M9901 Segmental and somatic dysfunction of cervical region: Secondary | ICD-10-CM | POA: Diagnosis not present

## 2017-09-07 DIAGNOSIS — M5416 Radiculopathy, lumbar region: Secondary | ICD-10-CM | POA: Diagnosis not present

## 2017-09-07 DIAGNOSIS — S161XXA Strain of muscle, fascia and tendon at neck level, initial encounter: Secondary | ICD-10-CM | POA: Diagnosis not present

## 2017-09-07 DIAGNOSIS — M9902 Segmental and somatic dysfunction of thoracic region: Secondary | ICD-10-CM | POA: Diagnosis not present

## 2017-09-08 DIAGNOSIS — M9902 Segmental and somatic dysfunction of thoracic region: Secondary | ICD-10-CM | POA: Diagnosis not present

## 2017-09-08 DIAGNOSIS — M9901 Segmental and somatic dysfunction of cervical region: Secondary | ICD-10-CM | POA: Diagnosis not present

## 2017-09-08 DIAGNOSIS — S161XXA Strain of muscle, fascia and tendon at neck level, initial encounter: Secondary | ICD-10-CM | POA: Diagnosis not present

## 2017-09-08 DIAGNOSIS — M5416 Radiculopathy, lumbar region: Secondary | ICD-10-CM | POA: Diagnosis not present

## 2017-09-19 DIAGNOSIS — S161XXA Strain of muscle, fascia and tendon at neck level, initial encounter: Secondary | ICD-10-CM | POA: Diagnosis not present

## 2017-09-19 DIAGNOSIS — M9902 Segmental and somatic dysfunction of thoracic region: Secondary | ICD-10-CM | POA: Diagnosis not present

## 2017-09-19 DIAGNOSIS — M5416 Radiculopathy, lumbar region: Secondary | ICD-10-CM | POA: Diagnosis not present

## 2017-09-19 DIAGNOSIS — M9901 Segmental and somatic dysfunction of cervical region: Secondary | ICD-10-CM | POA: Diagnosis not present

## 2017-09-21 DIAGNOSIS — S161XXA Strain of muscle, fascia and tendon at neck level, initial encounter: Secondary | ICD-10-CM | POA: Diagnosis not present

## 2017-09-21 DIAGNOSIS — M9901 Segmental and somatic dysfunction of cervical region: Secondary | ICD-10-CM | POA: Diagnosis not present

## 2017-09-21 DIAGNOSIS — M5416 Radiculopathy, lumbar region: Secondary | ICD-10-CM | POA: Diagnosis not present

## 2017-09-21 DIAGNOSIS — M9902 Segmental and somatic dysfunction of thoracic region: Secondary | ICD-10-CM | POA: Diagnosis not present

## 2017-09-22 DIAGNOSIS — M9902 Segmental and somatic dysfunction of thoracic region: Secondary | ICD-10-CM | POA: Diagnosis not present

## 2017-09-22 DIAGNOSIS — M5416 Radiculopathy, lumbar region: Secondary | ICD-10-CM | POA: Diagnosis not present

## 2017-09-22 DIAGNOSIS — S161XXA Strain of muscle, fascia and tendon at neck level, initial encounter: Secondary | ICD-10-CM | POA: Diagnosis not present

## 2017-09-22 DIAGNOSIS — M9901 Segmental and somatic dysfunction of cervical region: Secondary | ICD-10-CM | POA: Diagnosis not present

## 2017-09-26 DIAGNOSIS — M9902 Segmental and somatic dysfunction of thoracic region: Secondary | ICD-10-CM | POA: Diagnosis not present

## 2017-09-26 DIAGNOSIS — M5416 Radiculopathy, lumbar region: Secondary | ICD-10-CM | POA: Diagnosis not present

## 2017-09-26 DIAGNOSIS — M9901 Segmental and somatic dysfunction of cervical region: Secondary | ICD-10-CM | POA: Diagnosis not present

## 2017-09-26 DIAGNOSIS — S161XXA Strain of muscle, fascia and tendon at neck level, initial encounter: Secondary | ICD-10-CM | POA: Diagnosis not present

## 2017-09-28 DIAGNOSIS — M5416 Radiculopathy, lumbar region: Secondary | ICD-10-CM | POA: Diagnosis not present

## 2017-09-28 DIAGNOSIS — M9902 Segmental and somatic dysfunction of thoracic region: Secondary | ICD-10-CM | POA: Diagnosis not present

## 2017-09-28 DIAGNOSIS — M9901 Segmental and somatic dysfunction of cervical region: Secondary | ICD-10-CM | POA: Diagnosis not present

## 2017-09-28 DIAGNOSIS — S161XXA Strain of muscle, fascia and tendon at neck level, initial encounter: Secondary | ICD-10-CM | POA: Diagnosis not present

## 2017-10-05 DIAGNOSIS — M5416 Radiculopathy, lumbar region: Secondary | ICD-10-CM | POA: Diagnosis not present

## 2017-10-05 DIAGNOSIS — M9901 Segmental and somatic dysfunction of cervical region: Secondary | ICD-10-CM | POA: Diagnosis not present

## 2017-10-05 DIAGNOSIS — M9902 Segmental and somatic dysfunction of thoracic region: Secondary | ICD-10-CM | POA: Diagnosis not present

## 2017-10-05 DIAGNOSIS — S161XXA Strain of muscle, fascia and tendon at neck level, initial encounter: Secondary | ICD-10-CM | POA: Diagnosis not present

## 2017-10-06 DIAGNOSIS — M5416 Radiculopathy, lumbar region: Secondary | ICD-10-CM | POA: Diagnosis not present

## 2017-10-06 DIAGNOSIS — S161XXA Strain of muscle, fascia and tendon at neck level, initial encounter: Secondary | ICD-10-CM | POA: Diagnosis not present

## 2017-10-06 DIAGNOSIS — M9901 Segmental and somatic dysfunction of cervical region: Secondary | ICD-10-CM | POA: Diagnosis not present

## 2017-10-06 DIAGNOSIS — M9902 Segmental and somatic dysfunction of thoracic region: Secondary | ICD-10-CM | POA: Diagnosis not present

## 2017-10-10 DIAGNOSIS — M5416 Radiculopathy, lumbar region: Secondary | ICD-10-CM | POA: Diagnosis not present

## 2017-10-10 DIAGNOSIS — S161XXA Strain of muscle, fascia and tendon at neck level, initial encounter: Secondary | ICD-10-CM | POA: Diagnosis not present

## 2017-10-10 DIAGNOSIS — M9902 Segmental and somatic dysfunction of thoracic region: Secondary | ICD-10-CM | POA: Diagnosis not present

## 2017-10-10 DIAGNOSIS — M9901 Segmental and somatic dysfunction of cervical region: Secondary | ICD-10-CM | POA: Diagnosis not present

## 2017-10-12 DIAGNOSIS — M5416 Radiculopathy, lumbar region: Secondary | ICD-10-CM | POA: Diagnosis not present

## 2017-10-12 DIAGNOSIS — M9901 Segmental and somatic dysfunction of cervical region: Secondary | ICD-10-CM | POA: Diagnosis not present

## 2017-10-12 DIAGNOSIS — S161XXA Strain of muscle, fascia and tendon at neck level, initial encounter: Secondary | ICD-10-CM | POA: Diagnosis not present

## 2017-10-12 DIAGNOSIS — M9902 Segmental and somatic dysfunction of thoracic region: Secondary | ICD-10-CM | POA: Diagnosis not present

## 2017-10-13 DIAGNOSIS — S161XXA Strain of muscle, fascia and tendon at neck level, initial encounter: Secondary | ICD-10-CM | POA: Diagnosis not present

## 2017-10-13 DIAGNOSIS — M5416 Radiculopathy, lumbar region: Secondary | ICD-10-CM | POA: Diagnosis not present

## 2017-10-13 DIAGNOSIS — M9901 Segmental and somatic dysfunction of cervical region: Secondary | ICD-10-CM | POA: Diagnosis not present

## 2017-10-13 DIAGNOSIS — M9902 Segmental and somatic dysfunction of thoracic region: Secondary | ICD-10-CM | POA: Diagnosis not present

## 2017-11-21 ENCOUNTER — Other Ambulatory Visit: Payer: Self-pay | Admitting: Internal Medicine

## 2017-11-21 NOTE — Telephone Encounter (Signed)
Rx request sent to pharmacy.  

## 2018-02-16 ENCOUNTER — Ambulatory Visit: Payer: Medicare Other | Admitting: Family

## 2018-02-16 ENCOUNTER — Encounter (HOSPITAL_COMMUNITY): Payer: Medicare Other

## 2018-03-21 ENCOUNTER — Ambulatory Visit (INDEPENDENT_AMBULATORY_CARE_PROVIDER_SITE_OTHER): Payer: Medicare Other | Admitting: Family

## 2018-03-21 ENCOUNTER — Encounter: Payer: Self-pay | Admitting: Family

## 2018-03-21 ENCOUNTER — Ambulatory Visit (HOSPITAL_COMMUNITY)
Admission: RE | Admit: 2018-03-21 | Discharge: 2018-03-21 | Disposition: A | Discharge status: Home / Self Care | Payer: Medicare Other | Source: Ambulatory Visit | Attending: Family | Admitting: Family

## 2018-03-21 VITALS — BP 124/71 | HR 60 | Temp 97.0°F | Resp 18 | Ht 70.0 in | Wt 173.0 lb

## 2018-03-21 DIAGNOSIS — F17200 Nicotine dependence, unspecified, uncomplicated: Secondary | ICD-10-CM

## 2018-03-21 DIAGNOSIS — I6523 Occlusion and stenosis of bilateral carotid arteries: Secondary | ICD-10-CM | POA: Diagnosis not present

## 2018-03-21 DIAGNOSIS — Z9889 Other specified postprocedural states: Secondary | ICD-10-CM

## 2018-03-21 NOTE — Patient Instructions (Signed)
Steps to Quit Smoking Smoking tobacco can be bad for your health. It can also affect almost every organ in your body. Smoking puts you and people around you at risk for many serious long-lasting (chronic) diseases. Quitting smoking is hard, but it is one of the best things that you can do for your health. It is never too late to quit. What are the benefits of quitting smoking? When you quit smoking, you lower your risk for getting serious diseases and conditions. They can include:  Lung cancer or lung disease.  Heart disease.  Stroke.  Heart attack.  Not being able to have children (infertility).  Weak bones (osteoporosis) and broken bones (fractures).  If you have coughing, wheezing, and shortness of breath, those symptoms may get better when you quit. You may also get sick less often. If you are pregnant, quitting smoking can help to lower your chances of having a baby of low birth weight. What can I do to help me quit smoking? Talk with your doctor about what can help you quit smoking. Some things you can do (strategies) include:  Quitting smoking totally, instead of slowly cutting back how much you smoke over a period of time.  Going to in-person counseling. You are more likely to quit if you go to many counseling sessions.  Using resources and support systems, such as: ? Online chats with a counselor. ? Phone quitlines. ? Printed self-help materials. ? Support groups or group counseling. ? Text messaging programs. ? Mobile phone apps or applications.  Taking medicines. Some of these medicines may have nicotine in them. If you are pregnant or breastfeeding, do not take any medicines to quit smoking unless your doctor says it is okay. Talk with your doctor about counseling or other things that can help you.  Talk with your doctor about using more than one strategy at the same time, such as taking medicines while you are also going to in-person counseling. This can help make  quitting easier. What things can I do to make it easier to quit? Quitting smoking might feel very hard at first, but there is a lot that you can do to make it easier. Take these steps:  Talk to your family and friends. Ask them to support and encourage you.  Call phone quitlines, reach out to support groups, or work with a counselor.  Ask people who smoke to not smoke around you.  Avoid places that make you want (trigger) to smoke, such as: ? Bars. ? Parties. ? Smoke-break areas at work.  Spend time with people who do not smoke.  Lower the stress in your life. Stress can make you want to smoke. Try these things to help your stress: ? Getting regular exercise. ? Deep-breathing exercises. ? Yoga. ? Meditating. ? Doing a body scan. To do this, close your eyes, focus on one area of your body at a time from head to toe, and notice which parts of your body are tense. Try to relax the muscles in those areas.  Download or buy apps on your mobile phone or tablet that can help you stick to your quit plan. There are many free apps, such as QuitGuide from the CDC (Centers for Disease Control and Prevention). You can find more support from smokefree.gov and other websites.  This information is not intended to replace advice given to you by your health care provider. Make sure you discuss any questions you have with your health care provider. Document Released: 08/07/2009 Document   Revised: 06/08/2016 Document Reviewed: 02/25/2015 Elsevier Interactive Patient Education  2018 Elsevier Inc.     Stroke Prevention Some health problems and behaviors may make it more likely for you to have a stroke. Below are ways to lessen your risk of having a stroke.  Be active for at least 30 minutes on most or all days.  Do not smoke. Try not to be around others who smoke.  Do not drink too much alcohol. ? Do not have more than 2 drinks a day if you are a man. ? Do not have more than 1 drink a day if you  are a woman and are not pregnant.  Eat healthy foods, such as fruits and vegetables. If you were put on a specific diet, follow the diet as told.  Keep your cholesterol levels under control through diet and medicines. Look for foods that are low in saturated fat, trans fat, cholesterol, and are high in fiber.  If you have diabetes, follow all diet plans and take your medicine as told.  Ask your doctor if you need treatment to lower your blood pressure. If you have high blood pressure (hypertension), follow all diet plans and take your medicine as told by your doctor.  If you are 18-39 years old, have your blood pressure checked every 3-5 years. If you are age 40 or older, have your blood pressure checked every year.  Keep a healthy weight. Eat foods that are low in calories, salt, saturated fat, trans fat, and cholesterol.  Do not take drugs.  Avoid birth control pills, if this applies. Talk to your doctor about the risks of taking birth control pills.  Talk to your doctor if you have sleep problems (sleep apnea).  Take all medicine as told by your doctor. ? You may be told to take aspirin or blood thinner medicine. Take this medicine as told by your doctor. ? Understand your medicine instructions.  Make sure any other conditions you have are being taken care of.  Get help right away if:  You suddenly lose feeling (you feel numb) or have weakness in your face, arm, or leg.  Your face or eyelid hangs down to one side.  You suddenly feel confused.  You have trouble talking (aphasia) or understanding what people are saying.  You suddenly have trouble seeing in one or both eyes.  You suddenly have trouble walking.  You are dizzy.  You lose your balance or your movements are clumsy (uncoordinated).  You suddenly have a very bad headache and you do not know the cause.  You have new chest pain.  Your heart feels like it is fluttering or skipping a beat (irregular  heartbeat). Do not wait to see if the symptoms above go away. Get help right away. Call your local emergency services (911 in [place] Do not drive yourself to the hospital. This information is not intended to replace advice given to you by your health care provider. Make sure you discuss any questions you have with your health care provider. Document Released: 04/11/2012 Document Revised: 03/18/2016 Document Reviewed: 04/13/2013 Elsevier Interactive Patient Education  2018 Elsevier Inc.  

## 2018-03-21 NOTE — Progress Notes (Signed)
Chief Complaint: Follow up Extracranial Carotid Artery Stenosis   History of Present Illness  Shawn [name]. is a 72 y.o. male who is status post left CEA in 2010 by [name] for 95%+ stenosis. He returns today for follow up.  He had a preoperative TIA as manifested by transient expressive aphasia; he denies a history of hemiparesis, amaurosis fugax, or unilateral facial drooping. He denies any subsequent neurological event.   He denies claudication symptoms with walking. 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  Diabetic: No Tobacco use: smoker (1-2 ppd, started at age 54 or 24 yrs)  Pt meds include: Statin : Yes ASA: Yes Other anticoagulants/antiplatelets: no, was taking Xarelto; due to easy bleeding from skin and nose bleeds, stopped by pt who states he notified, Dr. Rennis Golden. No atrial flutter since hospitalized for this in 2014   Past Medical History:  Diagnosis Date  . Arthritis    DDD lower spine  . Atrial fibrillation (HCC)    atrial flutter  . Carotid artery occlusion   . Coronary artery disease   . History of nuclear stress test 08/2009   bruce myoview; normal pattern of perfusion; low risk   . Hyperlipidemia   . Hypotension 04/11/2012  . Kidney stones about 2000, and feb 2015   passed the 2015 stone  . PAD (peripheral artery disease) (HCC)   . Peripheral arterial disease (HCC)   . Pneumonia    hx of   . Shortness of breath   . SSS (sick sinus syndrome) (HCC) 04/11/2012    Social History Social History   Tobacco Use  . Smoking status: Current Every Day Smoker    Packs/day: 2.00    Years: 50.00    Pack years: 100.00    Types: Cigarettes  . Smokeless tobacco: Never Used  . Tobacco comment: smokes 1 ppd (02/05/15)  Substance Use Topics  . Alcohol use: No    Alcohol/week: 0.0 oz  . Drug use: No    Family History Family History  Problem Relation Age of Onset  . Hypertension Mother   . Hypertension  Father     Surgical History Past Surgical History:  Procedure Laterality Date  . CARDIAC CATHETERIZATION  08/2009   mod 2 vessel disease (L main 20-30% distal taper, LAD 50% stenosis in prox protion, diagonal branch 60% segmental stenosis, Cfx hsa 60-70% eccentric stenosis in AV groove Cfx)   . CAROTID ENDARTERECTOMY Left May 21, 2009   CE , Dr. Moishe Spice  . CYSTOSCOPY/RETROGRADE/URETEROSCOPY/STONE EXTRACTION WITH BASKET  about 2000  . TRANSTHORACIC ECHOCARDIOGRAM  03/2012   EF 55-60%, normal LV systolic function; mild MR; LA mod dilated    Allergies  Allergen Reactions  . Aleve [Naproxen Sodium] Anaphylaxis and Shortness Of Breath  . Penicillins Hives and Itching  . Sulfa Antibiotics Hives    Current Outpatient Medications  Medication Sig Dispense Refill  . ALPRAZolam (XANAX) 0.5 MG tablet Take 0.5 mg by mouth at bedtime as needed for anxiety.     Marland Kitchen aspirin 81 MG tablet Take 81 mg by mouth daily.    . betamethasone valerate (VALISONE) 0.1 % cream Apply 1 application topically daily as needed (rash).     . Cyanocobalamin (VITAMIN B-12 PO) Take 1 tablet by mouth daily.    Marland Kitchen ibuprofen (ADVIL,MOTRIN) 200 MG tablet Take 400 mg by mouth every 6 (six) hours as needed for headache (headache).    Marland Kitchen  phenylephrine (NEO-SYNEPHRINE) 1 % nasal spray Place 1 drop into the nose 3 (three) times daily.    . pravastatin (PRAVACHOL) 40 MG tablet TAKE 1 TABLET BY MOUTH AT BEDTIME FOR CHOLESTEROL 90 tablet 1   No current facility-administered medications for this visit.     Review of Systems : See HPI for pertinent positives and negatives.  Physical Examination  Vitals:   03/21/18 1507 03/21/18 1510  BP: 117/72 124/71  Pulse: 61 60  Resp: 18   Temp: (!) 97 F (36.1 C)   TempSrc: Oral   SpO2: 97% 97%  Weight: 173 lb (78.5 kg)   Height:  (1.778 m)    Body mass index is 24.82 kg/m.  General: WDWN male in NAD GAIT:normal HENT: no gross abnormalities  Eyes:  PERRLA Pulmonary: Respirations are non-labored, CTAB, diminished air movement in all fields Cardiac: Regular rhythm and rate, no detected murmur.  VASCULAR EXAM Carotid Bruits Left Right   Negative Negative   Abdominal aortic pulse is not palpable  Radial pulses are 2+ palpable and equal.      LE Pulses LEFT RIGHT   POPLITEAL  2+palpable  2+ palpable   POSTERIOR TIBIAL  not palpable  2+ palpable        DORSALIS PEDIS  1+ palpable 1+ palpable    Gastrointestinal: soft, nontender, BS WNL, no r/g, no palpable masses. Musculoskeletal: No muscle atrophy/wasting. M/S 5/5 throughout, Extremities without ischemic changes. Skin: No rashes, no ulcers, no cellulitis. Sun tanned.  Neurologic:  A&O X 3; appropriate affect, sensation is normal; speech is normal, CN 2-12 intact, pain and light touch intact in extremities, motor exam as listed above. Psychiatric: Normal thought content, mood appropriate to clinical situation.    Assessment: Shawn [name]. is a 72 y.o. male who is status post left CEA in 2010 for 95%+ stenosis. He had a pre-opertive TIA as manifested by expressive aphasia, none subsequently.   Fortunately he does not have DM, unfortunately he continues to smoke 1-2 ppd, seems receptive to quitting.  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    DATA Carotid Duplex (03/21/18): Right ICA: 1-39% stenosis Left ICA: CEA site with 1-39% stenosis Bilateral vertebral artery flow is antegrade.  Bilateral subclavian artery waveforms are normal.  Slight increased stenosis in the left ICA (CEA site) compared to the exam on 02-10-17.    Plan:  The patient was counseled re smoking cessation and given several free resources re smoking cessation.  Follow-up in  18 months with Carotid Duplex scan.   I discussed in depth with the patient the nature of atherosclerosis, and emphasized the importance of maximal medical management including strict control of blood pressure, blood glucose, and lipid levels, obtaining regular exercise, and cessation of smoking.  The patient is aware that without maximal medical management the underlying atherosclerotic disease process will progress, limiting the benefit of any interventions. The patient was given information about stroke prevention and what symptoms should prompt the patient to seek immediate medical care. Thank you for allowing [place] to participate in this patient's care.  Charisse March, RN, MSN, FNP-C [facility] Office: [phone]  Clinic Physician: Early  03/21/18 3:15 PM

## 2018-03-24 DIAGNOSIS — J0100 Acute maxillary sinusitis, unspecified: Secondary | ICD-10-CM | POA: Diagnosis not present

## 2018-04-10 DIAGNOSIS — J069 Acute upper respiratory infection, unspecified: Secondary | ICD-10-CM | POA: Diagnosis not present

## 2018-04-10 DIAGNOSIS — Z0001 Encounter for general adult medical examination with abnormal findings: Secondary | ICD-10-CM | POA: Diagnosis not present

## 2018-04-10 DIAGNOSIS — Z6824 Body mass index (BMI) 24.0-24.9, adult: Secondary | ICD-10-CM | POA: Diagnosis not present

## 2018-04-10 DIAGNOSIS — Z1389 Encounter for screening for other disorder: Secondary | ICD-10-CM | POA: Diagnosis not present

## 2018-04-20 DIAGNOSIS — Z1211 Encounter for screening for malignant neoplasm of colon: Secondary | ICD-10-CM | POA: Diagnosis not present

## 2018-05-01 DIAGNOSIS — L820 Inflamed seborrheic keratosis: Secondary | ICD-10-CM | POA: Diagnosis not present

## 2018-05-11 ENCOUNTER — Other Ambulatory Visit: Payer: Self-pay | Admitting: Internal Medicine

## 2018-05-11 NOTE — Telephone Encounter (Signed)
Rx request sent to pharmacy.  

## 2018-08-02 ENCOUNTER — Encounter: Payer: Self-pay | Admitting: Internal Medicine

## 2018-08-02 ENCOUNTER — Ambulatory Visit: Payer: Medicare Other | Admitting: Internal Medicine

## 2018-08-02 VITALS — BP 143/86 | HR 52 | Ht 69.0 in | Wt 171.4 lb

## 2018-08-02 DIAGNOSIS — I480 Paroxysmal atrial fibrillation: Secondary | ICD-10-CM

## 2018-08-02 DIAGNOSIS — I2510 Atherosclerotic heart disease of native coronary artery without angina pectoris: Secondary | ICD-10-CM

## 2018-08-02 DIAGNOSIS — Z720 Tobacco use: Secondary | ICD-10-CM

## 2018-08-02 DIAGNOSIS — E785 Hyperlipidemia, unspecified: Secondary | ICD-10-CM

## 2018-08-02 NOTE — Patient Instructions (Signed)

## 2018-08-02 NOTE — Progress Notes (Signed)
OFFICE NOTE  Chief Complaint:  Routine follow-up  Primary Care Physician: Shawn FoundGolding, John, MD  HPI:  Shawn KickLeonard D Edgin Montez HagemanJr. is a pleasant 72 year old gentleman with a history of atrial flutter with rapid ventricular response. He converted to sinus with 2 occurrences. He was seen by EP and recommended amiodarone, which he had been tolerating well. At his last visit, I decreased that. He has also been on Xarelto with some nose bleeding; however, that has improved. He had been using Afrin and I have prescribed him for a Nasonex spray, which he alternated to try to get him off of the aspirin, which most likely was causing a vasomotor rhinitis. He has had no further episodes of epistaxis. He is doing fairly well on amiodarone; however, today we discussed the possible long-term toxicity of this medicine and how it would be more ideal for him to not be on it, given his younger age.  At his last office visit, we discussed possibly changing him off of amiodarone due to the risk of toxicity. I recommended multifactoral ever he cannot afford this. We also talked about changing to Xarelto however that was too expensive for him. He is currently gone back to amiodarone and aspirin. He does not have any recurrent A. fib that he or I are aware of. Unfortunately continues to smoke.  [name] for followup. He is feeling quite well. He did undergo pulmonary function testing which interestingly showed no evidence of COPD, despite his long smoking history. 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 Based on these findings we discontinued his amiodarone. He is maintaining sinus rhythm today.  [name] Overall he is doing well. He's had no recurrent A. fib that were aware of. [name] He had taken himself off of Xarelto due to nosebleeds. He's currently on aspirin. His  CHADSVASC score is 2, therefore anticoagulation with warfarin or a DOAC is indicated for stroke reduction. We discussed this at length today, but he is concerned about ongoing bleeding risks and wishes to stay on aspirin.  I saw Shawn [name] back in the office today for follow-up. Overall he is without complaints. He denies any recurrent A. fib in EKG shows sinus bradycardia today. He's only on aspirin, despite a CHADSVASC or up to. This is per his choice as he had significant nosebleeds on anticoagulation and discontinued it. He understands that he may need to go back on anticoagulation if he has recurrent A. fib. He seems to be tolerating pravastatin and his had excellent cholesterol control. I reviewed recent laboratory work from his primary care provider which shows good control. He denies any chest pain or worsening shortness of breath. Unfortunately continues to smoke. I did note that his primary care provider had ordered a CT scan of the chest and abdomen in 2015. This was abnormal and the fact that were scattered subcentimeter pulmonary nodules. No follow-up is occurred, but it was recommended at 6 months for a high risk patient.  07/26/2016  Shawn [name] returns today for follow-up. Overall he feels like he is doing well. He denies any chest pain or worsening shortness of breath. The pressures well-controlled today. EKG shows a sinus bradycardia at 52. He continues to be off of anticoagulation despite a CHADSVASC score of 2. Took himself off of Xarelto due to nosebleeds in the past.  08/03/2017  [name] 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 He denies any worsening shortness of breath or chest pain. He says is cut back his smoking a lot but still smokes about 8-10 cigarettes a day. He has a history of PAF and was on amiodarone however that was discontinued. Recently he's had no recurrent atrial fibrillation and he is aware of. He has a dyslipidemia history is well  and was intolerant to atorvastatin. He's not had any recent lipid testing but is on pravastatin 40 mg. There is a history of moderate coronary artery disease which is nonobstructive. He also has asymptomatic bradycardia which is been stable. Heart rate today is 48.  08/02/2018  Shawn [name] is seen today in follow-up.  Overall he is doing well denies any chest pain or shortness of breath.  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  He is cut back on his smoking but still smokes.  He has a history of PAF but has had no recurrence.  He is not on anticoagulation due to history of nosebleeds but is maintained sinus rhythm recently and is in sinus rhythm today.  His dyslipidemia has been well controlled.  PMHx:  Past Medical History:  Diagnosis Date  . Arthritis    DDD lower spine  . Atrial fibrillation (HCC)    atrial flutter  . Carotid artery occlusion   . Coronary artery disease   . History of nuclear stress test 08/2009   bruce myoview; normal pattern of perfusion; low risk   . Hyperlipidemia   . Hypotension 04/11/2012  . Kidney stones about 2000, and feb 2015   passed the 2015 stone  . PAD (peripheral artery disease) (HCC)   . Peripheral arterial disease (HCC)   . Pneumonia    hx of   . Shortness of breath   . SSS (sick sinus syndrome) (HCC) 04/11/2012    Past Surgical History:  Procedure Laterality Date  . CARDIAC CATHETERIZATION  08/2009   mod 2 vessel disease (L main 20-30% distal taper, LAD 50% stenosis in prox protion, diagonal branch 60% segmental stenosis, Cfx hsa 60-70% eccentric stenosis in AV groove Cfx)   . CAROTID ENDARTERECTOMY Left May 21, 2009   CE , Dr. Moishe Spice  . CYSTOSCOPY/RETROGRADE/URETEROSCOPY/STONE EXTRACTION WITH BASKET  about 2000  . TRANSTHORACIC ECHOCARDIOGRAM  03/2012   EF 55-60%, normal LV systolic function; mild MR; LA mod dilated    FAMHx:  Family History  Problem Relation Age of Onset  . Hypertension Mother   .  Hypertension Father     SOCHx:   reports that he has been smoking cigarettes. He has a 100.00 pack-year smoking history. He has never used smokeless tobacco. He reports that he does not drink alcohol or use drugs.  ALLERGIES:  Allergies  Allergen Reactions  . Aleve [Naproxen Sodium] Anaphylaxis and Shortness Of Breath  . Penicillins Hives and Itching  . Sulfa Antibiotics Hives    ROS: Pertinent items noted in HPI and remainder of comprehensive ROS otherwise negative.  HOME MEDS: Current Outpatient Medications  Medication Sig Dispense Refill  . ALPRAZolam (XANAX) 0.5 MG tablet Take 0.5 mg by mouth at bedtime as needed for anxiety.     Marland Kitchen aspirin 81 MG tablet Take 81 mg by mouth daily.    . betamethasone valerate (VALISONE) 0.1 % cream Apply 1 application topically daily as needed (rash).     . Cyanocobalamin (VITAMIN B-12 PO) Take 1 tablet by  mouth daily.    . pravastatin (PRAVACHOL) 40 MG tablet TAKE 1 TABLET BY MOUTH AT BEDTIME FOR CHOLESTEROL 90 tablet 0   No current facility-administered medications for this visit.     LABS/IMAGING: No results found for this or any previous visit (from the past 48 hour(s)). No results found.  VITALS: BP (!) 143/86   Pulse (!) 52   Ht 5\' 9"  (1.753 m)   Wt 171 lb 6.4 oz (77.7 kg)   BMI 25.31 kg/m   EXAM: General appearance: alert and no distress Neck: no carotid bruit and no JVD Lungs: clear to auscultation bilaterally Heart: Regular bradycardia Abdomen: soft, non-tender; bowel sounds normal; no masses,  no organomegaly Extremities: extremities normal, atraumatic, no cyanosis or edema Pulses: 2+ and symmetric Skin: Skin color, texture, turgor normal. No rashes or lesions Neurologic: Alert and oriented X 3, normal strength and tone. Normal symmetric reflexes. Normal coordination and gait Psych: Pleasant, normal  EKG: Sinus bradycardia at 53-personally reviewed  ASSESSMENT: 1. Paroxysmal atrial fibrillation - amiodarone d/c'd  due to abnormal DLCO 2. Chronic anticoagulation- CHADS2VASC score of 2 (patient took himself off of Xarelto due to nosebleeds) 3. Dyslipidemia-intolerance to atorvastatin 4. History of carotid artery disease status post left carotid endarterectomy 5. CAD with moderate disease, non-obstructive 6. Asymptomatic bradycardia 7. Subcentimeter pulmonary nodules in a smoker  PLAN: 1.   Shawn [name] has had no new issues over the last year.  Denies any worsening shortness of breath.  He is not anticoagulated for A. fib due to nosebleeds in the past.  He is on aspirin and has had no recent A. fib that he is aware of.  His cholesterol has been well controlled however he is intolerant to atorvastatin.  He had mild nonobstructive coronary disease in the past.  He continues to smoke and I have encouraged him to try to cut back if he can.  Follow-up with me annually or sooner as necessary.  Chrystie Nose, MD, [facility], FACP  [place]  [facility]  Medical Director of the Advanced Lipid Disorders &  Cardiovascular Risk Reduction Clinic Diplomate of the American Board of Clinical Lipidology Attending Cardiologist  Direct Dial: [phone]  Fax: [phone]  Website:  [email or web address] 08/02/2018, 3:32 PM

## 2018-08-29 ENCOUNTER — Other Ambulatory Visit: Payer: Self-pay | Admitting: Internal Medicine

## 2018-09-01 ENCOUNTER — Other Ambulatory Visit: Payer: Self-pay | Admitting: Internal Medicine

## 2018-09-01 MED ORDER — PRAVASTATIN SODIUM 40 MG PO TABS: 40.0000 mg | ORAL_TABLET | Freq: Every day | ORAL | 2 refills | Status: DC

## 2018-09-01 NOTE — Telephone Encounter (Signed)
Rx request sent to pharmacy.  

## 2018-09-01 NOTE — Telephone Encounter (Signed)
° ° ° °*  STAT* If patient is at the pharmacy, call can be transferred to refill team.   1. Which medications need to be refilled? (please list name of each medication and dose if known) pravastatin (PRAVACHOL) 40 MG tablet  2. Which pharmacy/location (including street and city if local pharmacy) is medication to be sent to? [facility] - [place], [place] - [address]  3. Do they need a 30 day or 90 day supply? 90

## 2019-02-13 DIAGNOSIS — J302 Other seasonal allergic rhinitis: Secondary | ICD-10-CM | POA: Diagnosis not present

## 2019-02-13 DIAGNOSIS — S00251A Superficial foreign body of right eyelid and periocular area, initial encounter: Secondary | ICD-10-CM | POA: Diagnosis not present

## 2019-02-28 ENCOUNTER — Other Ambulatory Visit: Payer: Self-pay | Admitting: Internal Medicine

## 2019-02-28 NOTE — Telephone Encounter (Signed)
Pravachol 40 mg refilled.

## 2019-03-21 DIAGNOSIS — Z0000 Encounter for general adult medical examination without abnormal findings: Secondary | ICD-10-CM | POA: Diagnosis not present

## 2019-03-21 DIAGNOSIS — I6522 Occlusion and stenosis of left carotid artery: Secondary | ICD-10-CM | POA: Diagnosis not present

## 2019-03-21 DIAGNOSIS — I10 Essential (primary) hypertension: Secondary | ICD-10-CM | POA: Diagnosis not present

## 2019-03-21 DIAGNOSIS — Z1389 Encounter for screening for other disorder: Secondary | ICD-10-CM | POA: Diagnosis not present

## 2019-03-21 DIAGNOSIS — E7849 Other hyperlipidemia: Secondary | ICD-10-CM | POA: Diagnosis not present

## 2019-04-25 DIAGNOSIS — W57XXXA Bitten or stung by nonvenomous insect and other nonvenomous arthropods, initial encounter: Secondary | ICD-10-CM | POA: Diagnosis not present

## 2019-04-25 DIAGNOSIS — R21 Rash and other nonspecific skin eruption: Secondary | ICD-10-CM | POA: Diagnosis not present

## 2019-08-15 ENCOUNTER — Other Ambulatory Visit: Payer: Self-pay

## 2019-08-15 ENCOUNTER — Ambulatory Visit: Payer: Medicare Other | Admitting: Internal Medicine

## 2019-08-15 VITALS — BP 120/74 | HR 45 | Ht 69.0 in | Wt 168.6 lb

## 2019-08-15 DIAGNOSIS — E785 Hyperlipidemia, unspecified: Secondary | ICD-10-CM

## 2019-08-15 DIAGNOSIS — I2510 Atherosclerotic heart disease of native coronary artery without angina pectoris: Secondary | ICD-10-CM

## 2019-08-15 DIAGNOSIS — I480 Paroxysmal atrial fibrillation: Secondary | ICD-10-CM | POA: Diagnosis not present

## 2019-08-15 DIAGNOSIS — Z720 Tobacco use: Secondary | ICD-10-CM | POA: Diagnosis not present

## 2019-08-15 NOTE — Progress Notes (Signed)
OFFICE NOTE  Chief Complaint:  Routine follow-up  Primary Care Physician: Assunta FoundGolding, John, MD  HPI:  Shawn KickLeonard D Allende Montez HagemanJr. is a pleasant 73 year old gentleman with a history of atrial flutter with rapid ventricular response. He converted to sinus with 2 occurrences. He was seen by EP and recommended amiodarone, which he had been tolerating well. At his last visit, I decreased that. He has also been on Xarelto with some nose bleeding; however, that has improved. He had been using Afrin and I have prescribed him for a Nasonex spray, which he alternated to try to get him off of the aspirin, which most likely was causing a vasomotor rhinitis. He has had no further episodes of epistaxis. He is doing fairly well on amiodarone; however, today we discussed the possible long-term toxicity of this medicine and how it would be more ideal for him to not be on it, given his younger age.  At his last office visit, we discussed possibly changing him off of amiodarone due to the risk of toxicity. I recommended multifactoral ever he cannot afford this. We also talked about changing to Xarelto however that was too expensive for him. He is currently gone back to amiodarone and aspirin. He does not have any recurrent A. fib that he or I are aware of. Unfortunately continues to smoke.  Mr. Shawn Shawn [name] returns today for followup. He is feeling quite well. He did undergo pulmonary function testing which interestingly showed no evidence of COPD, despite his long smoking history. 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 Based on these findings we discontinued his amiodarone. He is maintaining sinus rhythm today.  I saw Shawn Shawn [name] back in the office today. Overall he is doing well. He's had no recurrent A. fib that were aware of. [name] He had taken himself off of Xarelto due to nosebleeds. He's currently on aspirin. His  CHADSVASC score is 2, therefore anticoagulation with warfarin or a DOAC is indicated for stroke reduction. We discussed this at length today, but he is concerned about ongoing bleeding risks and wishes to stay on aspirin.  I saw Shawn Shawn [name] back in the office today for follow-up. Overall he is without complaints. He denies any recurrent A. fib in EKG shows sinus bradycardia today. He's only on aspirin, despite a CHADSVASC or up to. This is per his choice as he had significant nosebleeds on anticoagulation and discontinued it. He understands that he may need to go back on anticoagulation if he has recurrent A. fib. He seems to be tolerating pravastatin and his had excellent cholesterol control. I reviewed recent laboratory work from his primary care provider which shows good control. He denies any chest pain or worsening shortness of breath. Unfortunately continues to smoke. I did note that his primary care provider had ordered a CT scan of the chest and abdomen in 2015. This was abnormal and the fact that were scattered subcentimeter pulmonary nodules. No follow-up is occurred, but it was recommended at 6 months for a high risk patient.  07/26/2016  Shawn Shawn [name] returns today for follow-up. Overall he feels like he is doing well. He denies any chest pain or worsening shortness of breath. The pressures well-controlled today. EKG shows a sinus bradycardia at 52. He continues to be off of anticoagulation despite a CHADSVASC score of 2. Took himself off of Xarelto due to nosebleeds in the past.  08/03/2017  [name] for follow-up. He continues to work as a Development worker, community. He denies any worsening shortness of breath or chest pain. He says is cut back his smoking a lot but still smokes about 8-10 cigarettes a day. He has a history of PAF and was on amiodarone however that was discontinued. Recently he's had no recurrent atrial fibrillation and he is aware of. He has a dyslipidemia history is well  and was intolerant to atorvastatin. He's not had any recent lipid testing but is on pravastatin 40 mg. There is a history of moderate coronary artery disease which is nonobstructive. He also has asymptomatic bradycardia which is been stable. Heart rate today is 48.  08/02/2018  Shawn Shawn [name] is seen today in follow-up.  Overall he is doing well denies any chest pain or shortness of breath.  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  He is cut back on his smoking but still smokes.  He has a history of PAF but has had no recurrence.  He is not on anticoagulation due to history of nosebleeds but is maintained sinus rhythm recently and is in sinus rhythm today.  His dyslipidemia has been well controlled.  08/15/2019  Shawn Shawn [name] returns today for follow-up.  Overall he is without complaints.  He says he is fairly active and continues to do yard work.  He denies any chest pain or worsening shortness of breath.  Blood pressures well controlled today.  His weight has been stable.  He takes very little medication but has had fairly well-controlled lipid profile on a statin and takes a low-dose aspirin.  EKG shows a sinus bradycardia at 45.  He reports that his heart rate does increase with exercise and denies any symptoms concerning for chronotropic incompetence.  PMHx:  Past Medical History:  Diagnosis Date  . Arthritis    DDD lower spine  . Atrial fibrillation (HCC)    atrial flutter  . Carotid artery occlusion   . Coronary artery disease   . History of nuclear stress test 08/2009   bruce myoview; normal pattern of perfusion; low risk   . Hyperlipidemia   . Hypotension 04/11/2012  . Kidney stones about 2000, and feb 2015   passed the 2015 stone  . PAD (peripheral artery disease) ([place])   . Peripheral arterial disease ([place])   . Pneumonia    hx of   . Shortness of breath   . SSS (sick sinus syndrome) ([place]) 04/11/2012    Past Surgical History:  Procedure Laterality  Date  . CARDIAC CATHETERIZATION  08/2009   mod 2 vessel disease (L main 20-30% distal taper, LAD 50% stenosis in prox protion, diagonal branch 60% segmental stenosis, Cfx hsa 60-70% eccentric stenosis in AV groove Cfx)   . CAROTID ENDARTERECTOMY Left May 21, 2009   CE , Dr. Leata Mouse  . CYSTOSCOPY/RETROGRADE/URETEROSCOPY/STONE EXTRACTION WITH BASKET  about 2000  . TRANSTHORACIC ECHOCARDIOGRAM  03/2012   EF 55-60%, normal LV systolic function; mild MR; LA mod dilated    FAMHx:  Family History  Problem Relation Age of Onset  . Hypertension Mother   . Hypertension Father     SOCHx:   reports that he has been smoking cigarettes. He has a 100.00 pack-year smoking history. He has never used smokeless tobacco. He reports that he does not drink alcohol or use drugs.  ALLERGIES:  Allergies  Allergen Reactions  . Aleve [Naproxen Sodium] Anaphylaxis and Shortness Of Breath  . Penicillins  Hives and Itching  . Sulfa Antibiotics Hives    ROS: Pertinent items noted in HPI and remainder of comprehensive ROS otherwise negative.  HOME MEDS: Current Outpatient Medications  Medication Sig Dispense Refill  . ALPRAZolam (XANAX) 0.5 MG tablet Take 0.5 mg by mouth at bedtime as needed for anxiety.     Marland Kitchen aspirin 81 MG tablet Take 81 mg by mouth daily.    . betamethasone valerate (VALISONE) 0.1 % cream Apply 1 application topically daily as needed (rash).     . Cyanocobalamin (VITAMIN B-12 PO) Take 1 tablet by mouth daily.    . pravastatin (PRAVACHOL) 40 MG tablet TAKE 1 TABLET BY MOUTH AT BEDTIME FOR CHOLESTEROL 90 tablet 1   No current facility-administered medications for this visit.     LABS/IMAGING: No results found for this or any previous visit (from the past 48 hour(s)). No results found.  VITALS: BP 120/74   Pulse (!) 45   Ht 5\' 9"  (1.753 m)   Wt 168 lb 9.6 oz (76.5 kg)   SpO2 98%   BMI 24.90 kg/m   EXAM: General appearance: alert and no distress Neck: no carotid bruit and  no JVD Lungs: clear to auscultation bilaterally Heart: Regular bradycardia Abdomen: soft, non-tender; bowel sounds normal; no masses,  no organomegaly Extremities: extremities normal, atraumatic, no cyanosis or edema Pulses: 2+ and symmetric Skin: Skin color, texture, turgor normal. No rashes or lesions Neurologic: Alert and oriented X 3, normal strength and tone. Normal symmetric reflexes. Normal coordination and gait Psych: Pleasant, normal  EKG: Sinus bradycardia 45-personally reviewed  ASSESSMENT: 1. Paroxysmal atrial fibrillation - amiodarone d/c'd due to abnormal DLCO 2. Chronic anticoagulation- CHADS2VASC score of 2 (patient took himself off of Xarelto due to nosebleeds) 3. Dyslipidemia-intolerance to atorvastatin 4. History of carotid artery disease status post left carotid endarterectomy 5. CAD with moderate disease, non-obstructive 6. Asymptomatic bradycardia 7. Subcentimeter pulmonary nodules in a smoker  PLAN: 1.   Shawn Shawn [name] continues to do fairly well.  He denies any recurrent atrial fibrillation he is aware of.  He is had stable carotid disease.  His cholesterol is well controlled.  He is bradycardic but asymptomatic with that.  Unfortunately is not been able to stop smoking and I encouraged him to continue to cut that back.  He still active and still working as a [employer] and does a lot of work around Nutritional therapist.  Plan follow-up with me annually or sooner as necessary.  [employer], MD, [facility], FACP  [place]  [facility]  Medical Director of the Advanced Lipid Disorders &  Cardiovascular Risk Reduction Clinic Diplomate of the American Board of Clinical Lipidology Attending Cardiologist  Direct Dial: [phone]  Fax: [phone]  Website:  [email or web address] [email or web address] 08/15/2019, 3:27 PM

## 2019-08-15 NOTE — Patient Instructions (Signed)
Medication Instructions:  Dr Debara Pickett recommends that you continue on your current medications as directed. Please refer to the Current Medication list given to you today.  *If you need a refill on your cardiac medications before your next appointment, please call your pharmacy*  Follow-Up: At [facility], you and your health needs are our priority.  As part of our continuing mission to provide you with exceptional heart care, we have created designated Provider Care Teams.  These Care Teams include your primary Cardiologist (physician) and Advanced Practice Providers (APPs -  Physician Assistants and Nurse Practitioners) who all work together to provide you with the care you need, when you need it.  Your next appointment:   12 months - You will receive a reminder letter in the mail two months in advance. If you don't receive a letter, please call our office to schedule the follow-up appointment.  The format for your next appointment:   In Person  Provider:   You may see Pixie Casino, MD or one of the following Advanced Practice Providers on your designated Care Team:    Almyra Deforest, PA-C  Fabian Sharp, [place] or   Roby Lofts, [place]   Other Instructions Your physician discussed the hazards of tobacco use. Tobacco use cessation is recommended and techniques and options to help you quit were discussed.

## 2019-08-16 ENCOUNTER — Encounter: Payer: Self-pay | Admitting: Internal Medicine

## 2019-08-21 ENCOUNTER — Other Ambulatory Visit: Payer: Self-pay

## 2019-08-21 ENCOUNTER — Telehealth: Payer: Self-pay | Admitting: Orthopedic Surgery

## 2019-08-21 ENCOUNTER — Encounter (HOSPITAL_COMMUNITY): Payer: Self-pay

## 2019-08-21 ENCOUNTER — Emergency Department (HOSPITAL_COMMUNITY): Payer: Medicare Other

## 2019-08-21 ENCOUNTER — Emergency Department (HOSPITAL_COMMUNITY)
Admission: EM | Admit: 2019-08-21 | Discharge: 2019-08-21 | Disposition: A | Discharge status: Home / Self Care | Payer: Medicare Other | Attending: Emergency Medicine | Admitting: Emergency Medicine

## 2019-08-21 DIAGNOSIS — F17210 Nicotine dependence, cigarettes, uncomplicated: Secondary | ICD-10-CM | POA: Insufficient documentation

## 2019-08-21 DIAGNOSIS — M25562 Pain in left knee: Secondary | ICD-10-CM | POA: Diagnosis not present

## 2019-08-21 DIAGNOSIS — Z79899 Other long term (current) drug therapy: Secondary | ICD-10-CM | POA: Insufficient documentation

## 2019-08-21 DIAGNOSIS — Z7982 Long term (current) use of aspirin: Secondary | ICD-10-CM | POA: Diagnosis not present

## 2019-08-21 DIAGNOSIS — I10 Essential (primary) hypertension: Secondary | ICD-10-CM | POA: Insufficient documentation

## 2019-08-21 NOTE — Telephone Encounter (Signed)
Patient called to inquire about immediate availability for appointment with Dr Aline Brochure for a knee problem. Relayed Dr Aline Brochure is in surgery, and Dr Luna Glasgow has finished for the day. Patient said his knee has been popping out of joint then going back into place, and now has "really popped out"; said he bent down, heard a pop, and now can hardly walk and cannot bear much weight.  Relayed due to symptoms described, best to go to Emergency room for work up, particularly in event of dislocation, which must be performed at hospital emergency room. Michela Pitcher will go on to [employer].

## 2019-08-21 NOTE — ED Provider Notes (Signed)
[facility] EMERGENCY DEPARTMENT Provider Note   CSN: [id number] Arrival date & time: 08/21/19  1432     History   Chief Complaint Chief Complaint  Patient presents with  . Knee Pain    HPI Shawn Netz. is a 73 y.o. male with a history of tobacco abuse, CAD, atrial flutter, PAD, and hyperlipidemia who presents to the emergency department with intermittent left knee popping sensation for the past 4 weeks that was worse today and uncomfortable prompting ER visit.  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 Today this happened & he had onset of pain in the L knee with the popping sensation. Pain is now only with weighbearing & certain movements. Alleviated with rest. Denies prior knee surgeries. Denies fever, chills, redness, numbness, or weakness.      HPI  Past Medical History:  Diagnosis Date  . Arthritis    DDD lower spine  . Atrial fibrillation (HCC)    atrial flutter  . Carotid artery occlusion   . Coronary artery disease   . History of nuclear stress test 08/2009   bruce myoview; normal pattern of perfusion; low risk   . Hyperlipidemia   . Hypotension 04/11/2012  . Kidney stones about 2000, and feb 2015   passed the 2015 stone  . PAD (peripheral artery disease) ([place])   . Peripheral arterial disease ([place])   . Pneumonia    hx of   . Shortness of breath   . SSS (sick sinus syndrome) ([place]) 04/11/2012    Patient Active Problem List   Diagnosis Date Noted  . Pulmonary nodules 08/13/2015  . Aftercare following surgery of the circulatory system, [place] 12/13/2013  . Occlusion and stenosis of carotid artery without mention of cerebral infarction 12/15/2012  . Dyslipidemia 04/13/2012  . Hypotension 04/11/2012  . SSS (sick sinus syndrome) ([place]) 04/11/2012  . Tobacco abuse  04/10/2012  . PAF (paroxysmal atrial fibrillation) ([place]) 04/10/2012  . CAD (coronary artery disease) Non critical. Last cath 08/2009, 20-30% LM, 60% diag, 50% LAD mid, 60-70% LCX, EF 50% 04/10/2012  . S/P carotid endarterectomy: left, 04/2009 04/10/2012    Past Surgical History:  Procedure Laterality Date  . CARDIAC CATHETERIZATION  08/2009   mod 2 vessel disease (L main 20-30% distal taper, LAD 50% stenosis in prox protion, diagonal branch 60% segmental stenosis, Cfx hsa 60-70% eccentric stenosis in AV groove Cfx)   . CAROTID ENDARTERECTOMY Left May 21, 2009   CE , Dr. Leata Mouse  . CYSTOSCOPY/RETROGRADE/URETEROSCOPY/STONE EXTRACTION WITH BASKET  about 2000  . TRANSTHORACIC ECHOCARDIOGRAM  03/2012   EF 55-60%, normal LV systolic function; mild MR; LA mod dilated        Home Medications    Prior to Admission medications   Medication Sig Start Date End Date Taking? Authorizing Provider  ALPRAZolam Duanne Moron) 0.5 MG tablet Take 0.5 mg by mouth at bedtime as needed for anxiety.  12/05/13   [provider]  aspirin 81 MG tablet Take 81 mg by mouth daily.    [provider]  betamethasone valerate (VALISONE) 0.1 % cream Apply 1 application topically daily as needed (rash).  09/13/13   [provider]  Cyanocobalamin (VITAMIN B-12 PO) Take 1 tablet by mouth daily.    [provider]  pravastatin (PRAVACHOL) 40 MG tablet TAKE 1 TABLET  BY MOUTH AT BEDTIME FOR CHOLESTEROL 02/28/19   Hilty, Lisette Abu, MD    Family History Family History  Problem Relation Age of Onset  . Hypertension Mother   . Hypertension Father     Social History Social History   Tobacco Use  . Smoking status: Current Every Day Smoker    Packs/day: 2.00    Years: 50.00    Pack years: 100.00    Types: Cigarettes  . Smokeless tobacco: Never Used  . Tobacco comment: smokes 1 ppd (02/05/15)  Substance Use Topics  . Alcohol use: No    Alcohol/week: 0.0 standard drinks  . Drug use:  No     Allergies   Aleve [naproxen sodium], Penicillins, and Sulfa antibiotics   Review of Systems Review of Systems  Constitutional: Negative for chills and fever.  Musculoskeletal: Positive for arthralgias.  Skin: Negative for color change, rash and wound.  Neurological: Negative for weakness and numbness.     Physical Exam Updated Vital Signs BP (!) 141/93 (BP Location: Right Arm)   Pulse 60   Temp 98.3 F (36.8 C) (Oral)   Resp 16   Ht 5\' 9"  (1.753 m)   Wt 77.1 kg   SpO2 99%   BMI 25.10 kg/m   Physical Exam Vitals signs and nursing note reviewed.  Constitutional:      General: He is not in acute distress.    Appearance: He is not ill-appearing or toxic-appearing.  HENT:     Head: Normocephalic and atraumatic.  Cardiovascular:     Pulses:          Dorsalis pedis pulses are 2+ on the right side and 2+ on the left side.       Posterior tibial pulses are 2+ on the right side and 2+ on the left side.  Pulmonary:     Effort: Pulmonary effort is normal.  Musculoskeletal:     Comments: Lower extremities: No obvious deformity, appreciable swelling, edema, erythema, ecchymosis, warmth, or open wounds. Patient has intact AROM to bilateral hips, knees, ankles, and all digits- some pain w/ R knee active knee extension but intact fully. No significant tenderness to palpation. No joint laxity with varus/valgus/lachmans. NVI distally.   Skin:    General: Skin is warm and dry.     Capillary Refill: Capillary refill takes less than 2 seconds.  Neurological:     Mental Status: He is alert.     Comments: Alert. Clear speech. Sensation grossly intact to bilateral lower extremities. 5/5 strength with knee flexion/extension & ankle plantar/dorsiflexion bilaterally. Gait mildly antalgic.   Psychiatric:        Mood and Affect: Mood normal.        Behavior: Behavior normal.      ED Treatments / Results  Labs (all labs ordered are listed, but only abnormal results are displayed)  Labs Reviewed - No data to display  EKG None  Radiology Dg Knee Complete 4 Views Left  Result Date: 08/21/2019 CLINICAL DATA:  Left knee pain for 2 weeks.  No trauma. EXAM: LEFT KNEE - COMPLETE 4+ VIEW COMPARISON:  None. FINDINGS: No evidence of fracture, dislocation, or joint effusion. No evidence of arthropathy or other focal bone abnormality. Soft tissues are unremarkable. IMPRESSION: No acute fracture or dislocation. Electronically Signed   By: 08/23/2019 M.D.   On: 08/21/2019 15:56    Procedures Procedures (including critical care time)  SPLINT APPLICATION Date/Time: 4:50 PM Authorized by: 08/23/2019 Consent: Verbal consent obtained. Risks and  benefits: risks, benefits and alternatives were discussed Consent given by: patient Splint applied by: RN/NT Location details: LLE Splint type: knee immobilizer Supplies used: knee immobilizer.  Post-procedure: The splinted body part was neurovascularly unchanged following the procedure. Patient tolerance: Patient tolerated the procedure well with no immediate complications.  Medications Ordered in ED Medications - No data to display   Initial Impression / Assessment and Plan / ED Course  I have reviewed the triage vital signs and the nursing notes.  Pertinent labs & imaging results that were available during my care of the patient were reviewed by me and considered in my medical decision making (see chart for details).   Patient presents to the emergency department with intermittent left knee popping sensation for the past 4 weeks with a popping sensation today with onset of pain prompting ER visit.  Patient is nontoxic-appearing, no apparent distress, vitals without significant abnormality.  No overlying erythema/warmth or fevers to indicate infectious process such as septic joint.  X-ray negative for fracture or dislocation.  Able to flex/extend the knee against resistance making patellar/quadriceps tendon injury less  likely.  No significant joint laxity.  NVI distally. Unclear definitive etiology, possibly meniscal, will place in the immobilizer with orthopedics follow-up. PRICE.  Recommended Tylenol per OTC dosing to help with discomfort. I discussed results, treatment plan, need for follow-up, and return precautions with the patient. Provided opportunity for questions, patient confirmed understanding and is in agreement with plan.    Final Clinical Impressions(s) / ED Diagnoses   Final diagnoses:  Acute pain of left knee    ED Discharge Orders    None       Cherly Andersonetrucelli, Ellicia Alix R, PA-C 08/21/19 1650    Geoffery Lyonselo, [place], MD 08/21/19 2231

## 2019-08-21 NOTE — Discharge Instructions (Addendum)
Please read and follow all provided instructions.  You have been seen today for left knee pain.   Tests performed today include: An x-ray of the affected area - does NOT show any broken bones or dislocations.  Vital signs. See below for your results today.   Home care instructions: -- *PRICE in the first 24-48 hours after injury: Protect (with brace, splint, sling), if given by your provider Rest Ice- Do not apply ice pack directly to your skin, place towel or similar between your skin and ice/ice pack. Apply ice for 20 min, then remove for 40 min while awake Compression- Wear brace, elastic bandage, splint as directed by your provider Elevate affected extremity above the level of your heart when not walking around for the first 24-48 hours   Medications:  You may take tylenol per over the counter dosing to help with pain.   Follow-up instructions: Please follow-up with your primary care provider or the provided orthopedic physician (bone specialist) if you continue to have significant pain in 1 week. In this case you may have a more severe injury that requires further care.   Return instructions:  Please return if your digits or extremity are numb or tingling, appear gray or blue, or you have severe pain (also elevate the extremity and loosen splint or wrap if you were given one) Please return if you have redness or fevers.  Please return to the Emergency Department if you experience worsening symptoms.  Please return if you have any other emergent concerns. Additional Information:  Your vital signs today were: BP (!) 141/93 (BP Location: Right Arm)    Pulse 60    Temp 98.3 F (36.8 C) (Oral)    Resp 16    Ht 5\' 9"  (1.753 m)    Wt 77.1 kg    SpO2 99%    BMI 25.10 kg/m  If your blood pressure (BP) was elevated above 135/85 this visit, please have this repeated by your doctor within one month. ---------------

## 2019-08-21 NOTE — ED Triage Notes (Signed)
Pt presents to ED with complaints of left knee pain x 2 weeks. 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

## 2019-08-24 ENCOUNTER — Encounter: Payer: Self-pay | Admitting: Orthopedic Surgery

## 2019-08-24 ENCOUNTER — Other Ambulatory Visit: Payer: Self-pay

## 2019-08-24 ENCOUNTER — Ambulatory Visit: Payer: Medicare Other | Admitting: Orthopedic Surgery

## 2019-08-24 VITALS — BP 141/81 | HR 57 | Ht 69.0 in | Wt 170.0 lb

## 2019-08-24 DIAGNOSIS — M25462 Effusion, left knee: Secondary | ICD-10-CM | POA: Diagnosis not present

## 2019-08-24 DIAGNOSIS — M23322 Other meniscus derangements, posterior horn of medial meniscus, left knee: Secondary | ICD-10-CM

## 2019-08-24 MED ORDER — PREDNISONE 10 MG PO TABS: 10.0000 mg | ORAL_TABLET | Freq: Two times a day (BID) | ORAL | 1 refills | Status: DC

## 2019-08-24 NOTE — Progress Notes (Signed)
Shawn Kick Vossler Jr.  08/24/2019  HISTORY SECTION :  Chief Complaint  Patient presents with  . 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  He had some preinjury catching and jumping in the knee where it was hard to straighten it but he could always work it out  However on this occasion when he squatted down he held a crunching noise and then he could not walk at severe pain loss of motion  He went to the emergency room x-rays there were inconclusive for any acute process   Review of Systems  All other systems reviewed and are negative.    has a past medical history of Arthritis, Atrial fibrillation [facility]), Carotid artery occlusion, Coronary artery disease, History of nuclear stress test (08/2009), Hyperlipidemia, Hypotension (04/11/2012), Kidney stones (about 2000, and feb 2015), PAD (peripheral artery disease) (HCC), Peripheral arterial disease (HCC), Pneumonia, Shortness of breath, and SSS (sick sinus syndrome) (HCC) (04/11/2012).   Past Surgical History:  Procedure Laterality Date  . CARDIAC CATHETERIZATION  08/2009   mod 2 vessel disease (L main 20-30% distal taper, LAD 50% stenosis in prox protion, diagonal branch 60% segmental stenosis, Cfx hsa 60-70% eccentric stenosis in AV groove Cfx)   . CAROTID ENDARTERECTOMY Left May 21, 2009   CE , Dr. Moishe Spice  . CYSTOSCOPY/RETROGRADE/URETEROSCOPY/STONE EXTRACTION WITH BASKET  about 2000  . TRANSTHORACIC ECHOCARDIOGRAM  03/2012   EF 55-60%, normal LV systolic function; mild MR; LA mod dilated    Body mass index is 25.1 kg/m.   Allergies  Allergen Reactions  . Aleve [Naproxen Sodium] Anaphylaxis and Shortness Of Breath  . Penicillins Hives and Itching  . Sulfa Antibiotics Hives     Current Outpatient Medications:  .  ALPRAZolam (XANAX) 0.5 MG tablet,  Take 0.5 mg by mouth at bedtime as needed for anxiety. , Disp: , Rfl:  .  aspirin 81 MG tablet, Take 81 mg by mouth daily., Disp: , Rfl:  .  betamethasone valerate (VALISONE) 0.1 % cream, Apply 1 application topically daily as needed (rash). , Disp: , Rfl:  .  Cyanocobalamin (VITAMIN B-12 PO), Take 1 tablet by mouth daily., Disp: , Rfl:  .  pravastatin (PRAVACHOL) 40 MG tablet, TAKE 1 TABLET BY MOUTH AT BEDTIME FOR CHOLESTEROL, Disp: 90 tablet, Rfl: 1 .  predniSONE (DELTASONE) 10 MG tablet, Take 1 tablet (10 mg total) by mouth 2 (two) times daily with a meal., Disp: 30 tablet, Rfl: 1   PHYSICAL EXAM SECTION: 1) BP (!) 141/81   Pulse (!) 57   Ht 5\' 9"  (1.753 m)   Wt 170 lb (77.1 kg)   BMI 25.10 kg/m   Body mass index is 25.1 kg/m. General appearance: Well-developed well-nourished no gross deformities  2) Cardiovascular normal pulse and perfusion in the lower extremities normal color without edema  3) Neurologically deep tendon reflexes are equal and normal, no sensation loss or deficits no pathologic reflexes  4) Psychological: Awake alert and oriented x3 mood and affect normal  5) Skin no lacerations or ulcerations no nodularity no palpable masses, no erythema or nodularity  6) Musculoskeletal:   Right knee  skin is normal  no tenderness  normal range of motion  knee is stable  muscle tone is normal  Neuro and  vascular exam is  intact  Left knee  Skin is normal without rash Tender medial joint line joint effusion noted moderate Normal passive range of motion Ligaments stable Muscle tone normal Neuro and Vascular exam normal McMurray's exam positive medial meniscal tear   MEDICAL DECISION SECTION:  Encounter Diagnoses  Name Primary?  . Effusion of knee joint, left Yes  . Derangement of posterior horn of medial meniscus of left knee     Imaging CLINICAL DATA:  Left knee pain for 2 weeks.  No trauma.   EXAM: LEFT KNEE - COMPLETE 4+ VIEW   COMPARISON:  None.    FINDINGS: No evidence of fracture, dislocation, or joint effusion. No evidence of arthropathy or other focal bone abnormality. Soft tissues are unremarkable.   IMPRESSION: No acute fracture or dislocation.     Electronically Signed   By: Abelardo Diesel M.D.   On: 08/21/2019 15:56   Plan:  (Rx., Inj., surg., Frx, MRI/CT, XR:2)  My plan is to aspirate and inject the knee  Hinged knee brace  Oral anti-inflammatories  Return to see if he improves if not we can always go to more extensive imaging  Procedure note injection and aspiration left knee joint  Verbal consent was obtained to aspirate and inject the left knee joint   Timeout was completed to confirm the site of aspiration and injection  An 18-gauge needle was used to aspirate the left knee joint from a suprapatellar lateral approach.  The medications used were 40 mg of Depo-Medrol and 1% lidocaine 3 cc  Anesthesia was provided by ethyl chloride and the skin was prepped with alcohol.  After cleaning the skin with alcohol an 18-gauge needle was used to aspirate the right knee joint.  We obtained 17 cc of fluid clear yellow  We followed this by injection of 40 mg of Depo-Medrol and 3 cc 1% lidocaine.  There were no complications. A sterile bandage was applied.   11:07 AM Arther Abbott, MD  08/24/2019

## 2019-08-24 NOTE — Patient Instructions (Signed)
Ice 30 min daily  Start medication take 1 twice a dy with food   Ok to start back to work Monday   Wear brace during the day but not to bed   Come back in 2 weeks

## 2019-09-10 ENCOUNTER — Encounter: Payer: Self-pay | Admitting: Orthopedic Surgery

## 2019-09-10 ENCOUNTER — Other Ambulatory Visit: Payer: Self-pay

## 2019-09-10 ENCOUNTER — Ambulatory Visit: Payer: Medicare Other | Admitting: Orthopedic Surgery

## 2019-09-10 VITALS — BP 121/73 | HR 73 | Ht 69.0 in | Wt 170.0 lb

## 2019-09-10 DIAGNOSIS — M25462 Effusion, left knee: Secondary | ICD-10-CM | POA: Diagnosis not present

## 2019-09-10 NOTE — Progress Notes (Signed)
Chief Complaint  Patient presents with  . 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  . Effusion of knee joint, left Yes    Plan is to release him in follow-up as needed

## 2019-09-19 ENCOUNTER — Other Ambulatory Visit: Payer: Self-pay | Admitting: Internal Medicine

## 2019-10-03 ENCOUNTER — Telehealth (HOSPITAL_COMMUNITY): Payer: Self-pay | Admitting: *Deleted

## 2019-10-03 NOTE — Telephone Encounter (Signed)

## 2019-10-04 ENCOUNTER — Other Ambulatory Visit: Payer: Self-pay

## 2019-10-04 ENCOUNTER — Encounter: Payer: Self-pay | Admitting: Family

## 2019-10-04 ENCOUNTER — Ambulatory Visit: Payer: Medicare Other | Admitting: Family

## 2019-10-04 ENCOUNTER — Ambulatory Visit (HOSPITAL_COMMUNITY)
Admission: RE | Admit: 2019-10-04 | Discharge: 2019-10-04 | Disposition: A | Discharge status: Home / Self Care | Payer: Medicare Other | Source: Ambulatory Visit | Attending: Family | Admitting: Family

## 2019-10-04 VITALS — BP 132/83 | HR 55 | Temp 98.0°F | Resp 12 | Ht 69.0 in | Wt 171.2 lb

## 2019-10-04 DIAGNOSIS — I6523 Occlusion and stenosis of bilateral carotid arteries: Secondary | ICD-10-CM

## 2019-10-04 DIAGNOSIS — F17200 Nicotine dependence, unspecified, uncomplicated: Secondary | ICD-10-CM | POA: Diagnosis not present

## 2019-10-04 DIAGNOSIS — Z9889 Other specified postprocedural states: Secondary | ICD-10-CM

## 2019-10-04 NOTE — Patient Instructions (Signed)
Steps to Quit Smoking Smoking tobacco is the leading cause of preventable death. It can affect almost every organ in the body. Smoking puts you and people around you at risk for many serious, long-lasting (chronic) diseases. Quitting smoking can be hard, but it is one of the best things that you can do for your health. It is never too late to quit. How do I get ready to quit? When you decide to quit smoking, make a plan to help you succeed. Before you quit:  Pick a date to quit. Set a date within the next 2 weeks to give you time to prepare.  Write down the reasons why you are quitting. Keep this list in places where you will see it often.  Tell your family, friends, and co-workers that you are quitting. Their support is important.  Talk with your doctor about the choices that may help you quit.  Find out if your health insurance will pay for these treatments.  Know the people, places, things, and activities that make you want to smoke (triggers). Avoid them. What first steps can I take to quit smoking?  Throw away all cigarettes at home, at work, and in your car.  Throw away the things that you use when you smoke, such as ashtrays and lighters.  Clean your car. Make sure to empty the ashtray.  Clean your home, including curtains and carpets. What can I do to help me quit smoking? Talk with your doctor about taking medicines and seeing a counselor at the same time. You are more likely to succeed when you do both.  If you are pregnant or breastfeeding, talk with your doctor about counseling or other ways to quit smoking. Do not take medicine to help you quit smoking unless your doctor tells you to do so. To quit smoking: Quit right away  Quit smoking totally, instead of slowly cutting back on how much you smoke over a period of time.  Go to counseling. You are more likely to quit if you go to counseling sessions regularly. Take medicine You may take medicines to help you quit. Some  medicines need a prescription, and some you can buy over-the-counter. Some medicines may contain a drug called nicotine to replace the nicotine in cigarettes. Medicines may:  Help you to stop having the desire to smoke (cravings).  Help to stop the problems that come when you stop smoking (withdrawal symptoms). Your doctor may ask you to use:  Nicotine patches, gum, or lozenges.  Nicotine inhalers or sprays.  Non-nicotine medicine that is taken by mouth. Find resources Find resources and other ways to help you quit smoking and remain smoke-free after you quit. These resources are most helpful when you use them often. They include:  Online chats with a counselor.  Phone quitlines.  Printed self-help materials.  Support groups or group counseling.  Text messaging programs.  Mobile phone apps. Use apps on your mobile phone or tablet that can help you stick to your quit plan. There are many free apps for mobile phones and tablets as well as websites. Examples include Quit Guide from the CDC and smokefree.gov  What things can I do to make it easier to quit?   Talk to your family and friends. Ask them to support and encourage you.  Call a phone quitline (1-800-QUIT-NOW), reach out to support groups, or work with a counselor.  Ask people who smoke to not smoke around you.  Avoid places that make you want to smoke,   such as: ? Bars. ? Parties. ? Smoke-break areas at work.  Spend time with people who do not smoke.  Lower the stress in your life. Stress can make you want to smoke. Try these things to help your stress: ? Getting regular exercise. ? Doing deep-breathing exercises. ? Doing yoga. ? Meditating. ? Doing a body scan. To do this, close your eyes, focus on one area of your body at a time from head to toe. Notice which parts of your body are tense. Try to relax the muscles in those areas. How will I feel when I quit smoking? Day 1 to 3 weeks Within the first 24 hours,  you may start to have some problems that come from quitting tobacco. These problems are very bad 2-3 days after you quit, but they do not often last for more than 2-3 weeks. You may get these symptoms:  Mood swings.  Feeling restless, nervous, angry, or annoyed.  Trouble concentrating.  Dizziness.  Strong desire for high-sugar foods and nicotine.  Weight gain.  Trouble pooping (constipation).  Feeling like you may vomit (nausea).  Coughing or a sore throat.  Changes in how the medicines that you take for other issues work in your body.  Depression.  Trouble sleeping (insomnia). Week 3 and afterward After the first 2-3 weeks of quitting, you may start to notice more positive results, such as:  Better sense of smell and taste.  Less coughing and sore throat.  Slower heart rate.  Lower blood pressure.  Clearer skin.  Better breathing.  Fewer sick days. Quitting smoking can be hard. Do not give up if you fail the first time. Some people need to try a few times before they succeed. Do your best to stick to your quit plan, and talk with your doctor if you have any questions or concerns. Summary  Smoking tobacco is the leading cause of preventable death. Quitting smoking can be hard, but it is one of the best things that you can do for your health.  When you decide to quit smoking, make a plan to help you succeed.  Quit smoking right away, not slowly over a period of time.  When you start quitting, seek help from your doctor, family, or friends. This information is not intended to replace advice given to you by your health care provider. Make sure you discuss any questions you have with your health care provider. Document Released: 08/07/2009 Document Revised: 12/29/2018 Document Reviewed: 12/30/2018 Elsevier Patient Education  2020 Else[employer] Inc[employer]     Stroke Prevention Some medical conditions and lifestyle choices can lead to a higher risk for a stroke. You can  help to prevent a stroke by making nutrition, lifestyle, and other changes. What nutrition changes can be made?   Eat healthy foods. ? Choose foods that are high in fiber. These include:  Fresh fruits.  Fresh vegetables.  Whole grains. ? Eat at least 5 or more servings of fruits and vegetables each day. Try to fill half of your plate at each meal with fruits and vegetables. ? Choose lean protein foods. These include:  Lowfat (lean) cuts of meat.  Chicken without skin.  Fish.  Tofu.  Beans.  Nuts. ? Eat low-fat dairy products. ? Avoid foods that:  Are high in salt (sodium).  Have saturated fat.  Have trans fat.  Have cholesterol.  Are processed.  Are premade.  Follow eating guidelines as told by your doctor. These may include: ? Reducing how many calories you   eat and drink each day. ? Limiting how much salt you eat or drink each day to 1,500 milligrams (mg). ? Using only healthy fats for cooking. These include:  Olive oil.  Canola oil.  Sunflower oil. ? Counting how many carbohydrates you eat and drink each day. What lifestyle changes can be made?  Try to stay at a healthy weight. Talk to your doctor about what a good weight is for you.  Get at least 30 minutes of moderate physical activity at least 5 days a week. This can include: ? Fast walking. ? Biking. ? Swimming.  Do not use any products that have nicotine or tobacco. This includes cigarettes and e-cigarettes. If you need help quitting, ask your doctor. Avoid being around tobacco smoke in general.  Limit how much alcohol you drink to no more than 1 drink a day for nonpregnant women and 2 drinks a day for men. One drink equals 12 oz of beer, 5 oz of wine, or 1 oz of hard liquor.  Do not use drugs.  Avoid taking birth control pills. Talk to your doctor about the risks of taking birth control pills if: ? You are over 35 years old. ? You smoke. ? You get migraines. ? You have had a blood clot.  What other changes can be made?  Manage your cholesterol. ? It is important to eat a healthy diet. ? If your cholesterol cannot be managed through your diet, you may also need to take medicines. Take medicines as told by your doctor.  Manage your diabetes. ? It is important to eat a healthy diet and to exercise regularly. ? If your blood sugar cannot be managed through diet and exercise, you may need to take medicines. Take medicines as told by your doctor.  Control your high blood pressure (hypertension). ? Try to keep your blood pressure below 130/80. This can help lower your risk of stroke. ? It is important to eat a healthy diet and to exercise regularly. ? If your blood pressure cannot be managed through diet and exercise, you may need to take medicines. Take medicines as told by your doctor. ? Ask your doctor if you should check your blood pressure at home. ? Have your blood pressure checked every year. Do this even if your blood pressure is normal.  Talk to your doctor about getting checked for a sleep disorder. Signs of this can include: ? Snoring a lot. ? Feeling very tired.  Take over-the-counter and prescription medicines only as told by your doctor. These may include aspirin or blood thinners (antiplatelets or anticoagulants).  Make sure that any other medical conditions you have are managed. Where to find more information  American Stroke Association: www.strokeassociation.org  National Stroke Association: www.stroke.org Get help right away if:  You have any symptoms of stroke. "BE FAST" is an easy way to remember the main warning signs: ? B - Balance. Signs are dizziness, sudden trouble walking, or loss of balance. ? E - Eyes. Signs are trouble seeing or a sudden change in how you see. ? F - Face. Signs are sudden weakness or loss of feeling of the face, or the face or eyelid drooping on one side. ? A - Arms. Signs are weakness or loss of feeling in an arm. This  happens suddenly and usually on one side of the body. ? S - Speech. Signs are sudden trouble speaking, slurred speech, or trouble understanding what people say. ? T - Time. Time to call emergency   services. Write down what time symptoms started.  You have other signs of stroke, such as: ? A sudden, very bad headache with no known cause. ? Feeling sick to your stomach (nausea). ? Throwing up (vomiting). ? Jerky movements you cannot control (seizure). These symptoms may represent a serious problem that is an emergency. Do not wait to see if the symptoms will go away. Get medical help right away. Call your local emergency services (911 in the [place] Do not drive yourself to the hospital. Summary  You can prevent a stroke by eating healthy, exercising, not smoking, drinking less alcohol, and treating other health problems, such as diabetes, high blood pressure, or high cholesterol.  Do not use any products that contain nicotine or tobacco, such as cigarettes and e-cigarettes.  Get help right away if you have any signs or symptoms of a stroke. This information is not intended to replace advice given to you by your health care provider. Make sure you discuss any questions you have with your health care provider. Document Released: 04/11/2012 Document Revised: 12/07/2018 Document Reviewed: 01/12/2017 Elsevier Patient Education  2020 Elsevier Inc.  

## 2019-10-04 NOTE — Progress Notes (Signed)
Chief Complaint: Follow up Extracranial Carotid Artery Stenosis   History of Present Illness  Shawn Dermody. is a 73 y.o. male who is status post left CEA in 2010 by Dr. Oneida Alar for 95%+ stenosis. He returns today for follow up.  He had apreoperativeTIA as manifested by transient expressive aphasia; he denies a history of hemiparesis, amaurosis fugax, or unilateral facial drooping. He denies any subsequent neurological event.  He denies claudication symptoms with walking. He fell on his knee a few months ago, saw Dr. 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  He does not use ETOH, walks a great deal as part of his job as a Development worker, community.  Diabetic: No Tobacco use: smoker (1-2ppd, started at age 29 or 46yrs)  Pt meds include: Statin : Yes ASA: Yes Other anticoagulants/antiplatelets: no, was taking Xarelto;due to easy bleeding from skinand nose bleeds, stopped by pt who states he notified, Dr. Debara Pickett. No atrial flutter since hospitalized for this in 2014   Past Medical History:  Diagnosis Date  . Arthritis    DDD lower spine  . Atrial fibrillation (HCC)    atrial flutter  . Carotid artery occlusion   . Coronary artery disease   . History of nuclear stress test 08/2009   bruce myoview; normal pattern of perfusion; low risk   . Hyperlipidemia   . Hypotension 04/11/2012  . Kidney stones about 2000, and feb 2015   passed the 2015 stone  . PAD (peripheral artery disease) ([place])   . Peripheral arterial disease ([place])   . Pneumonia    hx of   . Shortness of breath   . SSS (sick sinus syndrome) ([place]) 04/11/2012    Social History Social History   Tobacco Use  . Smoking status: Current Every Day Smoker    Packs/day: 2.00    Years: 50.00    Pack years: 100.00    Types: Cigarettes  . Smokeless tobacco: Never Used  . Tobacco comment: smokes 1 ppd (02/05/15)  Substance Use Topics  . Alcohol use: No     Alcohol/week: 0.0 standard drinks  . Drug use: No    Family History Family History  Problem Relation Age of Onset  . Hypertension Mother   . Hypertension Father     Surgical History Past Surgical History:  Procedure Laterality Date  . CARDIAC CATHETERIZATION  08/2009   mod 2 vessel disease (L main 20-30% distal taper, LAD 50% stenosis in prox protion, diagonal branch 60% segmental stenosis, Cfx hsa 60-70% eccentric stenosis in AV groove Cfx)   . CAROTID ENDARTERECTOMY Left May 21, 2009   CE , Dr. Leata Mouse  . CYSTOSCOPY/RETROGRADE/URETEROSCOPY/STONE EXTRACTION WITH BASKET  about 2000  . TRANSTHORACIC ECHOCARDIOGRAM  03/2012   EF 55-60%, normal LV systolic function; mild MR; LA mod dilated    Allergies  Allergen Reactions  . Aleve [Naproxen Sodium] Anaphylaxis and Shortness Of Breath  . Penicillins Hives and Itching  . Sulfa Antibiotics Hives    Current Outpatient Medications  Medication Sig Dispense Refill  . ALPRAZolam (XANAX) 0.5 MG tablet Take 0.5 mg by mouth at bedtime as needed for anxiety.     Marland Kitchen aspirin 81 MG tablet Take 81 mg by mouth daily.    . betamethasone valerate (VALISONE) 0.1 % cream Apply 1 application topically daily as needed (rash).     . Cyanocobalamin (VITAMIN B-12 PO) Take 1 tablet by mouth daily.    Marland Kitchen  pravastatin (PRAVACHOL) 40 MG tablet TAKE 1 TABLET BY MOUTH AT BEDTIME FOR CHOLESTEROL 90 tablet 0  . predniSONE (DELTASONE) 10 MG tablet Take 1 tablet (10 mg total) by mouth 2 (two) times daily with a meal. 30 tablet 1   No current facility-administered medications for this visit.    Review of Systems : See HPI for pertinent positives and negatives.  Physical Examination  Vitals:   10/04/19 1419  BP: 132/83  Pulse: (!) 55  Resp: 12  Temp: 98 F (36.7 C)  TempSrc: Temporal  SpO2: 99%  Weight: 171 lb 3.2 oz (77.7 kg)  Height: 5\' 9"  (1.753 m)   Body mass index is 25.28 kg/m.  General: WDWN slim male in NAD GAIT: normal Eyes: Pupils  are equal and round HENT: No gross abnormalities.  Pulmonary:  Respirations are non-labored, fair air movement in all fields, no rales, rhonchi, or wheezes Cardiac: regular rhythm, no detected murmur.  VASCULAR EXAM Carotid Bruits Right Left   Negative Negative     Abdominal aortic pulse is not palpable. Radial pulses are 2+ palpable and equal.                                                                                                                      Gastrointestinal: soft, nontender, BS WNL, no r/g, no palpable masses. Musculoskeletal: no muscle atrophy/wasting. M/S 5/5 throughout, extremities without ischemic changes Skin: No rashes, no ulcers, no cellulitis.  Sun tanned.  Neurologic:  A&O X 3; appropriate affect, sensation is normal; speech is normal, CN 2-12 intact, pain and light touch intact in extremities, motor exam as listed above. Psychiatric: Normal thought content, mood appropriate to clinical situation.    DATA  Carotid Duplex (10-04-19): Right Carotid: Velocities in the right ICA are consistent with a 1-39% stenosis. Left Carotid: Patent carotid endarterectomy site with no evidence for restenosis. Vertebrals:  Bilateral vertebral arteries demonstrate antegrade flow. Subclavians: Normal flow hemodynamics were seen in bilateral subclavian arteries    Assessment: Shawn DameLeonard D Davison Jr. is a 73 y.o. male who is status post left CEA in 2010 for 95%+ stenosis. He had a pre-opertive TIA as manifested by expressive aphasia, none subsequently.   Carotid duplex today shows 1-39% right ICA stenosis and no restenosis at the left ICA.   Fortunately he does not have DM, unfortunately he continues to smoke1-2 ppd, seems receptive to quitting.  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   Over 3 minutes was spent counseling patient re smoking cessation, and patient was given several free resources re smoking cessation.     Plan: Follow-up in 18 months with Carotid Duplex scan.   I discussed in depth with the patient the nature of atherosclerosis, and emphasized the importance of maximal medical management including strict control of blood pressure, blood glucose, and lipid levels, obtaining regular exercise, and cessation of smoking.  The patient is aware that without maximal medical management the underlying  atherosclerotic disease process will progress, limiting the benefit of any interventions. The patient was given information about stroke prevention and what symptoms should prompt the patient to seek immediate medical care. Thank you for allowing [place] to participate in this patient's care.  Charisse March, RN, MSN, FNP-C [facility] Office: [phone]  Clinic Physician: Darrick Penna  10/04/19 2:23 PM

## 2019-10-23 ENCOUNTER — Other Ambulatory Visit: Payer: Self-pay | Admitting: *Deleted

## 2019-10-23 DIAGNOSIS — I6523 Occlusion and stenosis of bilateral carotid arteries: Secondary | ICD-10-CM

## 2020-01-14 ENCOUNTER — Encounter: Payer: Self-pay | Admitting: Orthopedic Surgery

## 2020-01-14 ENCOUNTER — Ambulatory Visit: Payer: Medicare Other | Admitting: Orthopedic Surgery

## 2020-01-14 ENCOUNTER — Other Ambulatory Visit: Payer: Self-pay

## 2020-01-14 VITALS — BP 149/90 | HR 70 | Ht 69.0 in | Wt 165.0 lb

## 2020-01-14 DIAGNOSIS — M25562 Pain in left knee: Secondary | ICD-10-CM

## 2020-01-14 NOTE — Patient Instructions (Signed)
Call office after 4 weeks if still symptomatic

## 2020-01-14 NOTE — Progress Notes (Signed)
Chief Complaint  Patient presents with  . Knee Pain    burning pain lateral knee when putting on socks/ no other time is it painful     74 year old male previously seen for pain and swelling left knee comes in today complaining of 2-week history of burning pain only in the morning when he puts his socks on.  Has no other pain swelling or symptoms  Past Medical History:  Diagnosis Date  . Arthritis    DDD lower spine  . Atrial fibrillation (HCC)    atrial flutter  . Carotid artery occlusion   . Coronary artery disease   . History of nuclear stress test 08/2009   bruce myoview; normal pattern of perfusion; low risk   . Hyperlipidemia   . Hypotension 04/11/2012  . Kidney stones about 2000, and feb 2015   passed the 2015 stone  . PAD (peripheral artery disease) (HCC)   . Peripheral arterial disease (HCC)   . Pneumonia    hx of   . Shortness of breath   . SSS (sick sinus syndrome) (HCC) 04/11/2012   BP (!) 149/90   Pulse 70   Ht 5\' 9"  (1.753 m)   Wt 165 lb (74.8 kg)   BMI 24.37 kg/m    His examination shows a normal knee with normal range of motion no tenderness stable ligaments negative McMurray's sign negative straight leg raise tests  Unsure of the diagnosis  Asked him to make a new appointment in 4 weeks for reevaluation possible MRI if symptoms do not go away

## 2020-03-31 ENCOUNTER — Other Ambulatory Visit: Payer: Self-pay | Admitting: Internal Medicine

## 2020-03-31 NOTE — Telephone Encounter (Signed)
Rx request sent to pharmacy.  

## 2020-04-05 DIAGNOSIS — H00025 Hordeolum internum left lower eyelid: Secondary | ICD-10-CM | POA: Diagnosis not present

## 2020-04-08 DIAGNOSIS — E7849 Other hyperlipidemia: Secondary | ICD-10-CM | POA: Diagnosis not present

## 2020-04-08 DIAGNOSIS — I6522 Occlusion and stenosis of left carotid artery: Secondary | ICD-10-CM | POA: Diagnosis not present

## 2020-04-08 DIAGNOSIS — I4891 Unspecified atrial fibrillation: Secondary | ICD-10-CM | POA: Diagnosis not present

## 2020-04-08 DIAGNOSIS — Z0001 Encounter for general adult medical examination with abnormal findings: Secondary | ICD-10-CM | POA: Diagnosis not present

## 2020-04-08 DIAGNOSIS — Z1389 Encounter for screening for other disorder: Secondary | ICD-10-CM | POA: Diagnosis not present

## 2020-04-08 DIAGNOSIS — I10 Essential (primary) hypertension: Secondary | ICD-10-CM | POA: Diagnosis not present

## 2020-04-08 DIAGNOSIS — Z6823 Body mass index (BMI) 23.0-23.9, adult: Secondary | ICD-10-CM | POA: Diagnosis not present

## 2020-04-12 DIAGNOSIS — H6121 Impacted cerumen, right ear: Secondary | ICD-10-CM | POA: Diagnosis not present

## 2020-04-12 DIAGNOSIS — Z1211 Encounter for screening for malignant neoplasm of colon: Secondary | ICD-10-CM | POA: Diagnosis not present

## 2020-04-15 DIAGNOSIS — H0014 Chalazion left upper eyelid: Secondary | ICD-10-CM | POA: Diagnosis not present

## 2020-08-25 ENCOUNTER — Ambulatory Visit: Payer: Medicare Other | Admitting: Internal Medicine

## 2020-08-25 ENCOUNTER — Other Ambulatory Visit: Payer: Self-pay

## 2020-08-25 ENCOUNTER — Encounter: Payer: Self-pay | Admitting: Internal Medicine

## 2020-08-25 VITALS — BP 140/76 | HR 51 | Temp 97.2°F | Ht 72.0 in | Wt 172.2 lb

## 2020-08-25 DIAGNOSIS — I480 Paroxysmal atrial fibrillation: Secondary | ICD-10-CM

## 2020-08-25 DIAGNOSIS — J449 Chronic obstructive pulmonary disease, unspecified: Secondary | ICD-10-CM | POA: Diagnosis not present

## 2020-08-25 DIAGNOSIS — E785 Hyperlipidemia, unspecified: Secondary | ICD-10-CM

## 2020-08-25 DIAGNOSIS — Z720 Tobacco use: Secondary | ICD-10-CM | POA: Diagnosis not present

## 2020-08-25 NOTE — Progress Notes (Signed)
OFFICE NOTE  Chief Complaint:  Routine follow-up  Primary Care Physician: Assunta FoundGolding, John, MD  HPI:  Shawn KickLeonard D Edgin Montez HagemanJr. is a pleasant 74 year old gentleman with a history of atrial flutter with rapid ventricular response. He converted to sinus with 2 occurrences. He was seen by EP and recommended amiodarone, which he had been tolerating well. At his last visit, I decreased that. He has also been on Xarelto with some nose bleeding; however, that has improved. He had been using Afrin and I have prescribed him for a Nasonex spray, which he alternated to try to get him off of the aspirin, which most likely was causing a vasomotor rhinitis. He has had no further episodes of epistaxis. He is doing fairly well on amiodarone; however, today we discussed the possible long-term toxicity of this medicine and how it would be more ideal for him to not be on it, given his younger age.  At his last office visit, we discussed possibly changing him off of amiodarone due to the risk of toxicity. I recommended multifactoral ever he cannot afford this. We also talked about changing to Xarelto however that was too expensive for him. He is currently gone back to amiodarone and aspirin. He does not have any recurrent A. fib that he or I are aware of. Unfortunately continues to smoke.  [name] for followup. He is feeling quite well. He did undergo pulmonary function testing which interestingly showed no evidence of COPD, despite his long smoking history. 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 Based on these findings we discontinued his amiodarone. He is maintaining sinus rhythm today.  [name] Overall he is doing well. He's had no recurrent A. fib that were aware of. [name] He had taken himself off of Xarelto due to nosebleeds. He's currently on aspirin. His  CHADSVASC score is 2, therefore anticoagulation with warfarin or a DOAC is indicated for stroke reduction. We discussed this at length today, but he is concerned about ongoing bleeding risks and wishes to stay on aspirin.  [name] Overall he is without complaints. He denies any recurrent A. fib in EKG shows sinus bradycardia today. He's only on aspirin, despite a CHADSVASC or up to. This is per his choice as he had significant nosebleeds on anticoagulation and discontinued it. He understands that he may need to go back on anticoagulation if he has recurrent A. fib. He seems to be tolerating pravastatin and his had excellent cholesterol control. I reviewed recent laboratory work from his primary care provider which shows good control. He denies any chest pain or worsening shortness of breath. Unfortunately continues to smoke. I did note that his primary care provider had ordered a CT scan of the chest and abdomen in 2015. This was abnormal and the fact that were scattered subcentimeter pulmonary nodules. No follow-up is occurred, but it was recommended at 6 months for a high risk patient.  07/26/2016  Shawn [name] returns today for follow-up. Overall he feels like he is doing well. He denies any chest pain or worsening shortness of breath. The pressures well-controlled today. EKG shows a sinus bradycardia at 52. He continues to be off of anticoagulation despite a CHADSVASC score of 2. Took himself off of Xarelto due to nosebleeds in the past.  08/03/2017  [name] for follow-up. 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 He denies any worsening shortness of breath or chest pain. He says is cut back his smoking a lot but still smokes about 8-10 cigarettes a day. He has a history of PAF and was on amiodarone however that was discontinued. Recently he's had no recurrent atrial fibrillation and he is aware of. He has a dyslipidemia history is well  and was intolerant to atorvastatin. He's not had any recent lipid testing but is on pravastatin 40 mg. There is a history of moderate coronary artery disease which is nonobstructive. He also has asymptomatic bradycardia which is been stable. Heart rate today is 48.  08/02/2018  Shawn [name] is seen today in follow-up.  Overall he is doing well denies any chest pain or shortness of breath.  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  He is cut back on his smoking but still smokes.  He has a history of PAF but has had no recurrence.  He is not on anticoagulation due to history of nosebleeds but is maintained sinus rhythm recently and is in sinus rhythm today.  His dyslipidemia has been well controlled.  08/15/2019  Shawn [name] returns today for follow-up.  Overall he is without complaints.  He says he is fairly active and continues to do yard work.  He denies any chest pain or worsening shortness of breath.  Blood pressures well controlled today.  His weight has been stable.  He takes very little medication but has had fairly well-controlled lipid profile on a statin and takes a low-dose aspirin.  EKG shows a sinus bradycardia at 45.  He reports that his heart rate does increase with exercise and denies any symptoms concerning for chronotropic incompetence.  08/25/2020  Shawn [name] is seen today in annual follow-up.  He continues to be active.  He still working as a Nutritional therapist.  He says he is asymptomatic.  He very rarely gets fatigue.  He does not have any significant or worsening shortness of breath.  He is known to have severe COPD which has been seen numerous times based on imaging in the past.  He is also continuing smoker with 100 pack years of smoking.  He says he has not been able to quit.  His last CT was in 2016 which showed stable scattered pulmonary nodules and no follow-up CT was recommended.  Despite this though because he continues to smoke, he is at increased risk  for pulmonary neoplasm.  I have previously recommended referral to pulmonary however he continues to decline.  He understands that he needs to stop smoking as the most important way to decrease the likelihood of worsening lung disease.  He thinks he may have had a chest x-ray earlier this year from his primary care provider.  I do not have any evidence to see that.  He denies any chest pain.  His lipids have been reasonably well controlled with an LDL of 84 on pravastatin.  He is also on low-dose aspirin but takes no other medications regularly.  PMHx:  Past Medical History:  Diagnosis Date  . Arthritis    DDD lower spine  . Atrial fibrillation (HCC)    atrial flutter  . Carotid artery occlusion   . Coronary artery disease   . History of nuclear stress test 08/2009   bruce myoview; normal pattern of perfusion; low risk   . Hyperlipidemia   . Hypotension 04/11/2012  .  Kidney stones about 2000, and feb 2015   passed the 2015 stone  . PAD (peripheral artery disease) (HCC)   . Peripheral arterial disease (HCC)   . Pneumonia    hx of   . Shortness of breath   . SSS (sick sinus syndrome) (HCC) 04/11/2012    Past Surgical History:  Procedure Laterality Date  . CARDIAC CATHETERIZATION  08/2009   mod 2 vessel disease (L main 20-30% distal taper, LAD 50% stenosis in prox protion, diagonal branch 60% segmental stenosis, Cfx hsa 60-70% eccentric stenosis in AV groove Cfx)   . CAROTID ENDARTERECTOMY Left May 21, 2009   CE , Dr. Moishe Spice  . CYSTOSCOPY/RETROGRADE/URETEROSCOPY/STONE EXTRACTION WITH BASKET  about 2000  . TRANSTHORACIC ECHOCARDIOGRAM  03/2012   EF 55-60%, normal LV systolic function; mild MR; LA mod dilated    FAMHx:  Family History  Problem Relation Age of Onset  . Hypertension Mother   . Hypertension Father     SOCHx:   reports that he has been smoking cigarettes. He has a 100.00 pack-year smoking history. He has never used smokeless tobacco. He reports that he does not  drink alcohol and does not use drugs.  ALLERGIES:  Allergies  Allergen Reactions  . Aleve [Naproxen Sodium] Anaphylaxis and Shortness Of Breath  . Penicillins Hives and Itching  . Sulfa Antibiotics Hives    ROS: Pertinent items noted in HPI and remainder of comprehensive ROS otherwise negative.  HOME MEDS: Current Outpatient Medications  Medication Sig Dispense Refill  . ALPRAZolam (XANAX) 0.5 MG tablet Take 0.5 mg by mouth at bedtime as needed for anxiety.     Marland Kitchen aspirin 81 MG tablet Take 81 mg by mouth daily.    . betamethasone valerate (VALISONE) 0.1 % cream Apply 1 application topically daily as needed (rash).     . Cyanocobalamin (VITAMIN B-12 PO) Take 1 tablet by mouth daily.    . pravastatin (PRAVACHOL) 40 MG tablet TAKE 1 TABLET BY MOUTH AT BEDTIME FOR CHOLESTEROL 90 tablet 1   No current facility-administered medications for this visit.    LABS/IMAGING: No results found for this or any previous visit (from the past 48 hour(s)). No results found.  VITALS: BP 140/76   Pulse (!) 51   Temp (!) 97.2 F (36.2 C)   Ht 6' (1.829 m)   Wt 172 lb 3.2 oz (78.1 kg)   SpO2 96%   BMI 23.35 kg/m   EXAM: General appearance: alert and no distress Neck: no carotid bruit and no JVD Lungs: clear to auscultation bilaterally Heart: Regular bradycardia Abdomen: soft, non-tender; bowel sounds normal; no masses,  no organomegaly Extremities: extremities normal, atraumatic, no cyanosis or edema Pulses: 2+ and symmetric Skin: Skin color, texture, turgor normal. No rashes or lesions Neurologic: Alert and oriented X 3, normal strength and tone. Normal symmetric reflexes. Normal coordination and gait Psych: Pleasant, normal  EKG: Sinus bradycardia 51, nonspecific T wave changes-personally reviewed  ASSESSMENT: 1. Paroxysmal atrial fibrillation - amiodarone d/c'd due to abnormal DLCO 2. Chronic anticoagulation- CHADS2VASC score of 2 (patient took himself off of Xarelto due to  nosebleeds) 3. Dyslipidemia-intolerance to atorvastatin 4. History of carotid artery disease status post left carotid endarterectomy 5. CAD with moderate disease, non-obstructive 6. Asymptomatic bradycardia 7. Subcentimeter pulmonary nodules in a smoker 8. Severe COPD  PLAN: 1.   Shawn [name] denies any anginal symptoms although we know he has moderate coronary disease.  His last stress test was low risk.  He denies  any worsening fatigue or shortness of breath.  He denies any wheezing or cough.  He is noted to have severe imaging evidence of COPD and had had prior centimeter pulmonary nodules which was not recommended for follow-up by radiology.  Because he is a continued smoker, I think he should have repeat CT scan screening for malignancy.  Will order low-dose screening CT.  He is followed primarily by his PCP for this but is on no inhalers.  I have again recommended a referral to pulmonary however he is declined.  Lipids are reasonably well controlled.  He is asymptomatic bradycardia.  He still able to work without any issues.  He has a history of PAF but is not on anticoagulation due to history of nosebleeds.  This is despite a CHA2DS2-VASc score of 2.  He is on low-dose aspirin.  He understands that this may be suboptimal treatment for stroke protection.  Plan follow-up with me annually or sooner as necessary.  Chrystie Nose, MD, [facility], FACP  [place]  [facility]  Medical Director of the [facility] Lipid [facility] &  Cardiovascular Risk Reduction Clinic Diplomate of the American Board of Clinical Lipidology Attending Cardiologist  Direct Dial: [phone]  Fax: [phone]  Website:  [email or web address] Nicely Caeleb Batalla 08/25/2020, 2:36 PM

## 2020-08-25 NOTE — Patient Instructions (Signed)
Medication Instructions:  Your physician recommends that you continue on your current medications as directed. Please refer to the Current Medication list given to you today.  *If you need a refill on your cardiac medications before your next appointment, please call your pharmacy*   Testing/Procedures: Chest CT for lung cancer screening at [facility]   Follow-Up: At [facility], you and your health needs are our priority.  As part of our continuing mission to provide you with exceptional heart care, we have created designated Provider Care Teams.  These Care Teams include your primary Cardiologist (physician) and Advanced Practice Providers (APPs -  Physician Assistants and Nurse Practitioners) who all work together to provide you with the care you need, when you need it.  We recommend signing up for the patient portal called "MyChart".  Sign up information is provided on this After Visit Summary.  MyChart is used to connect with patients for Virtual Visits (Telemedicine).  Patients are able to view lab/test results, encounter notes, upcoming appointments, etc.  Non-urgent messages can be sent to your provider as well.   To learn more about what you can do with MyChart, go to [email or web address].    Your next appointment:   12 month(s)  The format for your next appointment:   In Person  Provider:   You may see Chrystie Nose, MD or one of the following Advanced Practice Providers on your designated Care Team:    Azalee Course, PA-C  Micah Flesher, PA-C or   Judy Pimple, [place]    Other Instructions

## 2020-09-22 ENCOUNTER — Ambulatory Visit (HOSPITAL_COMMUNITY)
Admission: RE | Admit: 2020-09-22 | Discharge: 2020-09-22 | Disposition: A | Discharge status: Home / Self Care | Payer: Medicare Other | Source: Ambulatory Visit | Attending: Internal Medicine | Admitting: Internal Medicine

## 2020-09-22 ENCOUNTER — Other Ambulatory Visit: Payer: Self-pay

## 2020-09-22 DIAGNOSIS — I700 Atherosclerosis of aorta: Secondary | ICD-10-CM | POA: Diagnosis not present

## 2020-09-22 DIAGNOSIS — Z122 Encounter for screening for malignant neoplasm of respiratory organs: Secondary | ICD-10-CM | POA: Insufficient documentation

## 2020-09-22 DIAGNOSIS — J439 Emphysema, unspecified: Secondary | ICD-10-CM | POA: Diagnosis not present

## 2020-09-22 DIAGNOSIS — Z720 Tobacco use: Secondary | ICD-10-CM

## 2020-09-22 DIAGNOSIS — F17210 Nicotine dependence, cigarettes, uncomplicated: Secondary | ICD-10-CM | POA: Insufficient documentation

## 2020-09-28 ENCOUNTER — Other Ambulatory Visit: Payer: Self-pay | Admitting: Internal Medicine

## 2021-03-03 ENCOUNTER — Other Ambulatory Visit: Payer: Self-pay

## 2021-03-03 DIAGNOSIS — I6523 Occlusion and stenosis of bilateral carotid arteries: Secondary | ICD-10-CM

## 2021-03-30 ENCOUNTER — Ambulatory Visit (HOSPITAL_COMMUNITY)
Admission: RE | Admit: 2021-03-30 | Discharge: 2021-03-30 | Disposition: A | Discharge status: Home / Self Care | Payer: Medicare Other | Source: Ambulatory Visit | Attending: Surgery | Admitting: Surgery

## 2021-03-30 ENCOUNTER — Ambulatory Visit: Payer: Medicare Other | Admitting: Physician Assistant

## 2021-03-30 ENCOUNTER — Other Ambulatory Visit: Payer: Self-pay

## 2021-03-30 VITALS — BP 149/92 | HR 65 | Temp 97.7°F | Resp 16 | Ht 72.0 in | Wt 163.0 lb

## 2021-03-30 DIAGNOSIS — I6523 Occlusion and stenosis of bilateral carotid arteries: Secondary | ICD-10-CM

## 2021-03-30 NOTE — Progress Notes (Signed)
Carotid Artery Follow-Up   VASCULAR SURGERY ASSESSMENT & PLAN:   Shawn [name]. is a 75 y.o. male who is status post left CEA in 2010by Dr. Vena Rua 95%+ stenosis. He had apreoperativeTIA as manifested by transient expressive aphasia.  He had no residual deficits.  Carotid artery stenosis: The patient has no symptoms referable to carotid artery stenosis.  Duplex examination today is stable as compared to 18 months ago.  We reviewed the signs and symptoms of stroke/TIA and advised the patient to call EMS should these occur.   Continue optimal medical management of hypertension and follow-up with primary care physician. Encouraged complete smoking cessation. Continue the following medications: aspirin and statin Follow-up in 1 year with carotid duplex ultrasound.  SUBJECTIVE:   The patient denies monocular blindness, slurred speech, facial drooping, extremity weakness or numbness.  He denies claudication or rest pain.  PHYSICAL EXAM:   Vitals:   03/30/21 1436 03/30/21 1439  BP: (!) 145/87 (!) 149/92  Pulse: 65 65  Resp: 16   Temp: 97.7 F (36.5 C)   TempSrc: Temporal   SpO2: 98%   Weight: 163 lb (73.9 kg)   Height: 6' (1.829 m)     General appearance: Well-developed, well-nourished in no apparent distress Neurologic: Alert and oriented x4, tongue is midline, face symmetric, speech fluent, 5 out of 5 bilateral upper extremity grip strength, triceps and biceps strength Cardiovascular: Heart rate and rhythm are regular.  Pedal pulses are palpable.  No carotid bruits. Respirations: Nonlabored  NON-INVASIVE VASCULAR STUDIES   03/30/2021 Right Carotid: Velocities in the right ICA are consistent with a 1-39%  stenosis.   Left Carotid: Non-hemodynamically significant plaque <50% noted in the  CCA.   Vertebrals: Bilateral vertebral arteries demonstrate antegrade flow.  Subclavians: Normal flow hemodynamics were seen in bilateral subclavian arteries.   PROBLEM  LIST:    The patient's past medical history, past surgical history, family history, social history, allergy list and medication list are reviewed.   CURRENT MEDS:    Current Outpatient Medications:  .  ALPRAZolam (XANAX) 0.5 MG tablet, Take 0.5 mg by mouth at bedtime as needed for anxiety. , Disp: , Rfl:  .  aspirin 81 MG tablet, Take 81 mg by mouth daily., Disp: , Rfl:  .  betamethasone valerate (VALISONE) 0.1 % cream, Apply 1 application topically daily as needed (rash). , Disp: , Rfl:  .  Cyanocobalamin (VITAMIN B-12 PO), Take 1 tablet by mouth daily., Disp: , Rfl:  .  pravastatin (PRAVACHOL) 40 MG tablet, TAKE 1 TABLET BY MOUTH AT BEDTIME FOR CHOLESTEROL, Disp: 90 tablet, Rfl: 3   REVIEW OF SYSTEMS:   [X]  denotes positive finding, [ ]  denotes negative finding Cardiac  Comments:  Chest pain or chest pressure:    Shortness of breath upon exertion:    Short of breath when lying flat:    Irregular heart rhythm:        Vascular    Pain in calf, thigh, or hip brought on by ambulation:    Pain in feet at night that wakes you up from your sleep:     Blood clot in your veins:    Leg swelling:         Pulmonary    Oxygen at home:    Productive cough:     Wheezing:         Neurologic    Sudden weakness in arms or legs:     Sudden numbness in arms or legs:  Sudden onset of difficulty speaking or slurred speech:    Temporary loss of vision in one eye:     Problems with dizziness:         Gastrointestinal    Blood in stool:     Vomited blood:         Genitourinary    Burning when urinating:     Blood in urine:        Psychiatric    Major depression:         Hematologic    Bleeding problems:    Problems with blood clotting too easily:        Skin    Rashes or ulcers:        Constitutional    Fever or chills:     Milinda Antis, PA-C  Office: [phone] 03/30/2021

## 2021-07-29 DIAGNOSIS — I4891 Unspecified atrial fibrillation: Secondary | ICD-10-CM | POA: Diagnosis not present

## 2021-07-29 DIAGNOSIS — Z0000 Encounter for general adult medical examination without abnormal findings: Secondary | ICD-10-CM | POA: Diagnosis not present

## 2021-07-29 DIAGNOSIS — E782 Mixed hyperlipidemia: Secondary | ICD-10-CM | POA: Diagnosis not present

## 2021-07-29 DIAGNOSIS — I6522 Occlusion and stenosis of left carotid artery: Secondary | ICD-10-CM | POA: Diagnosis not present

## 2021-08-06 DIAGNOSIS — Z1211 Encounter for screening for malignant neoplasm of colon: Secondary | ICD-10-CM | POA: Diagnosis not present

## 2021-09-28 ENCOUNTER — Other Ambulatory Visit: Payer: Self-pay | Admitting: Internal Medicine

## 2021-11-09 ENCOUNTER — Other Ambulatory Visit: Payer: Self-pay

## 2021-11-09 ENCOUNTER — Ambulatory Visit: Payer: Medicare Other | Admitting: Internal Medicine

## 2021-11-09 ENCOUNTER — Encounter: Payer: Self-pay | Admitting: Internal Medicine

## 2021-11-09 VITALS — BP 150/90 | HR 50 | Ht 70.0 in | Wt 166.2 lb

## 2021-11-09 DIAGNOSIS — I480 Paroxysmal atrial fibrillation: Secondary | ICD-10-CM | POA: Diagnosis not present

## 2021-11-09 DIAGNOSIS — I10 Essential (primary) hypertension: Secondary | ICD-10-CM

## 2021-11-09 DIAGNOSIS — E785 Hyperlipidemia, unspecified: Secondary | ICD-10-CM

## 2021-11-09 DIAGNOSIS — J449 Chronic obstructive pulmonary disease, unspecified: Secondary | ICD-10-CM | POA: Diagnosis not present

## 2021-11-09 NOTE — Progress Notes (Signed)
OFFICE NOTE  Chief Complaint:  Routine follow-up  Primary Care Physician: Assunta Found, MD  HPI:  Shawn Shawn [name]. is a pleasant 76 year old gentleman with a history of atrial flutter with rapid ventricular response. He converted to sinus with 2 occurrences. He was seen by EP and recommended amiodarone, which he had been tolerating well. At his last visit, I decreased that. He has also been on Xarelto with some nose bleeding; however, that has improved. He had been using Afrin and I have prescribed him for a Nasonex spray, which he alternated to try to get him off of the aspirin, which most likely was causing a vasomotor rhinitis. He has had no further episodes of epistaxis. He is doing fairly well on amiodarone; however, today we discussed the possible long-term toxicity of this medicine and how it would be more ideal for him to not be on it, given his younger age.  At his last office visit, we discussed possibly changing him off of amiodarone due to the risk of toxicity. I recommended multifactoral ever he cannot afford this. We also talked about changing to Xarelto however that was too expensive for him. He is currently gone back to amiodarone and aspirin. He does not have any recurrent A. fib that he or I are aware of. Unfortunately continues to smoke.  Mr. Shawn Shawn [name] returns today for followup. He is feeling quite well. He did undergo pulmonary function testing which interestingly showed no evidence of COPD, despite his long smoking history. 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 Based on these findings we discontinued his amiodarone. He is maintaining sinus rhythm today.  I saw Shawn Shawn [name] back in the office today. Overall he is doing well. He's had no recurrent A. fib that were aware of. [name] He had taken himself off of Xarelto due to nosebleeds. He's currently on aspirin. His  CHADSVASC score is 2, therefore anticoagulation with warfarin or a DOAC is indicated for stroke reduction. We discussed this at length today, but he is concerned about ongoing bleeding risks and wishes to stay on aspirin.  I saw Shawn Shawn [name] back in the office today for follow-up. Overall he is without complaints. He denies any recurrent A. fib in EKG shows sinus bradycardia today. He's only on aspirin, despite a CHADSVASC or up to. This is per his choice as he had significant nosebleeds on anticoagulation and discontinued it. He understands that he may need to go back on anticoagulation if he has recurrent A. fib. He seems to be tolerating pravastatin and his had excellent cholesterol control. I reviewed recent laboratory work from his primary care provider which shows good control. He denies any chest pain or worsening shortness of breath. Unfortunately continues to smoke. I did note that his primary care provider had ordered a CT scan of the chest and abdomen in 2015. This was abnormal and the fact that were scattered subcentimeter pulmonary nodules. No follow-up is occurred, but it was recommended at 6 months for a high risk patient.  07/26/2016  Shawn Shawn [name] returns today for follow-up. Overall he feels like he is doing well. He denies any chest pain or worsening shortness of breath. The pressures well-controlled today. EKG shows a sinus bradycardia at 52. He continues to be off of anticoagulation despite a CHADSVASC score of 2. Took himself off of Xarelto due to nosebleeds in the past.  08/03/2017  [name] for follow-up. 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 He denies any worsening shortness of breath or chest pain. He says is cut back his smoking a lot but still smokes about 8-10 cigarettes a day. He has a history of PAF and was on amiodarone however that was discontinued. Recently he's had no recurrent atrial fibrillation and he is aware of. He has a dyslipidemia history is well  and was intolerant to atorvastatin. He's not had any recent lipid testing but is on pravastatin 40 mg. There is a history of moderate coronary artery disease which is nonobstructive. He also has asymptomatic bradycardia which is been stable. Heart rate today is 48.  08/02/2018  Shawn Shawn [name] is seen today in follow-up.  Overall he is doing well denies any chest pain or shortness of breath.  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  He is cut back on his smoking but still smokes.  He has a history of PAF but has had no recurrence.  He is not on anticoagulation due to history of nosebleeds but is maintained sinus rhythm recently and is in sinus rhythm today.  His dyslipidemia has been well controlled.  08/15/2019  Shawn Shawn [name] returns today for follow-up.  Overall he is without complaints.  He says he is fairly active and continues to do yard work.  He denies any chest pain or worsening shortness of breath.  Blood pressures well controlled today.  His weight has been stable.  He takes very little medication but has had fairly well-controlled lipid profile on a statin and takes a low-dose aspirin.  EKG shows a sinus bradycardia at 45.  He reports that his heart rate does increase with exercise and denies any symptoms concerning for chronotropic incompetence.  08/25/2020  Shawn Shawn [name] is seen today in annual follow-up.  He continues to be active.  He still working as a Nutritional therapistplumber.  He says he is asymptomatic.  He very rarely gets fatigue.  He does not have any significant or worsening shortness of breath.  He is known to have severe COPD which has been seen numerous times based on imaging in the past.  He is also continuing smoker with 100 pack years of smoking.  He says he has not been able to quit.  His last CT was in 2016 which showed stable scattered pulmonary nodules and no follow-up CT was recommended.  Despite this though because he continues to smoke, he is at increased risk  for pulmonary neoplasm.  I have previously recommended referral to pulmonary however he continues to decline.  He understands that he needs to stop smoking as the most important way to decrease the likelihood of worsening lung disease.  He thinks he may have had a chest x-ray earlier this year from his primary care provider.  I do not have any evidence to see that.  He denies any chest pain.  His lipids have been reasonably well controlled with an LDL of 84 on pravastatin.  He is also on low-dose aspirin but takes no other medications regularly.  11/09/2021  Shawn Shawn [name] returns today for routine follow-up.  Overall he says he is doing well.  He denies any worsening shortness of breath.  Despite having severe COPD he is not been able to quit smoking.  He has not had any respiratory illnesses winter which is good.  He denies any palpitations or recurrent A. fib.  Blood pressure was a little elevated today needs  to be reassessed at home.  He is not currently on any medicine for that.  He has not been on any anticoagulation for A. fib in the past due to issues with nosebleeds as per his preference.  He does take pravastatin and LDL was 74 in October of this year.  He does continue on low-dose aspirin.  PMHx:  Past Medical History:  Diagnosis Date   Arthritis    DDD lower spine   Atrial fibrillation (HCC)    atrial flutter   Carotid artery occlusion    Coronary artery disease    History of nuclear stress test 08/2009   bruce myoview; normal pattern of perfusion; low risk    Hyperlipidemia    Hypotension 04/11/2012   Kidney stones about 2000, and feb 2015   passed the 2015 stone   PAD (peripheral artery disease) (HCC)    Peripheral arterial disease (HCC)    Pneumonia    hx of    Shortness of breath    SSS (sick sinus syndrome) (HCC) 04/11/2012    Past Surgical History:  Procedure Laterality Date   CARDIAC CATHETERIZATION  08/2009   mod 2 vessel disease (L main 20-30% distal taper, LAD 50%  stenosis in prox protion, diagonal branch 60% segmental stenosis, Cfx hsa 60-70% eccentric stenosis in AV groove Cfx)    CAROTID ENDARTERECTOMY Left May 21, 2009   CE , Dr. Hildred Alamin. [place]   CYSTOSCOPY/RETROGRADE/URETEROSCOPY/STONE EXTRACTION WITH BASKET  about 2000   TRANSTHORACIC ECHOCARDIOGRAM  03/2012   EF 55-60%, normal LV systolic function; mild MR; LA mod dilated    FAMHx:  Family History  Problem Relation Age of Onset   Hypertension Mother    Hypertension Father     SOCHx:   reports that he has been smoking cigarettes. He has a 100.00 pack-year smoking history. He has never used smokeless tobacco. He reports that he does not drink alcohol and does not use drugs.  ALLERGIES:  Allergies  Allergen Reactions   Aleve [Naproxen Sodium] Anaphylaxis and Shortness Of Breath   Penicillins Hives and Itching   Sulfa Antibiotics Hives    ROS: Pertinent items noted in HPI and remainder of comprehensive ROS otherwise negative.  HOME MEDS: Current Outpatient Medications  Medication Sig Dispense Refill   ALPRAZolam (XANAX) 0.5 MG tablet Take 0.5 mg by mouth at bedtime as needed for anxiety.      aspirin 81 MG tablet Take 81 mg by mouth daily.     betamethasone valerate (VALISONE) 0.1 % cream Apply 1 application topically daily as needed (rash).      Cyanocobalamin (VITAMIN B-12 PO) Take 1 tablet by mouth daily.     pravastatin (PRAVACHOL) 40 MG tablet TAKE 1 TABLET BY MOUTH AT BEDTIME FOR CHOLESTEROL 90 tablet 0   No current facility-administered medications for this visit.    LABS/IMAGING: No results found for this or any previous visit (from the past 48 hour(s)). No results found.  VITALS: BP (!) 150/90    Pulse (!) 50    Ht 5\' 10"  (1.778 m)    Wt 166 lb 3.2 oz (75.4 kg)    SpO2 97%    BMI 23.85 kg/m   EXAM: General appearance: alert and no distress Neck: no carotid bruit and no JVD Lungs: clear to auscultation bilaterally Heart: Regular bradycardia Abdomen: soft,  non-tender; bowel sounds normal; no masses,  no organomegaly Extremities: extremities normal, atraumatic, no cyanosis or edema Pulses: 2+ and symmetric Skin: Skin color, texture, turgor normal.  No rashes or lesions Neurologic: Alert and oriented X 3, normal strength and tone. Normal symmetric reflexes. Normal coordination and gait Psych: Pleasant, normal  EKG: Sinus bradycardia at 50-personally reviewed  ASSESSMENT: Paroxysmal atrial fibrillation - amiodarone d/c'd due to abnormal DLCO Chronic anticoagulation- CHADS2VASC score of 2 (patient took himself off of Xarelto due to nosebleeds) Dyslipidemia-intolerance to atorvastatin History of carotid artery disease status post left carotid endarterectomy CAD with moderate disease, non-obstructive Asymptomatic bradycardia Subcentimeter pulmonary nodules in a smoker Severe COPD  PLAN: 1.   Shawn Shawn [name] denies any worsening shortness of breath.  Unfortunate continues to smoke.  He is not anticoagulated per his report request due to history of nosebleeds.  Is not had any recent A. fib again.  He is on pravastatin and has a good LDL cholesterol.  He is advised to monitor blood pressures at home as I think they are creeping up somewhat.  Is not currently on any medication.  He should contact me with the results of his home blood pressure readings  Plan follow-up with me annually or sooner as necessary.  Chrystie NoseKenneth C. Zabian Swayne, MD, [facility] CenterFACC, FACP  [place]   [facility]  Medical Director of the [facility] Lipid Disorders &  Cardiovascular Risk Reduction Clinic Diplomate of the American Board of Clinical Lipidology Attending Cardiologist  Direct Dial: [phone]   Fax: [phone]  Website:  [email or web address]   Reyah Streeter C Shanel Prazak 11/09/2021, 8:07 AM

## 2021-11-09 NOTE — Patient Instructions (Signed)
Medication Instructions:  Your physician recommends that you continue on your current medications as directed. Please refer to the Current Medication list given to you today.  *If you need a refill on your cardiac medications before your next appointment, please call your pharmacy*   Follow-Up: At [facility], you and your health needs are our priority.  As part of our continuing mission to provide you with exceptional heart care, we have created designated Provider Care Teams.  These Care Teams include your primary Cardiologist (physician) and Advanced Practice Providers (APPs -  Physician Assistants and Nurse Practitioners) who all work together to provide you with the care you need, when you need it.  We recommend signing up for the patient portal called "MyChart".  Sign up information is provided on this After Visit Summary.  MyChart is used to connect with patients for Virtual Visits (Telemedicine).  Patients are able to view lab/test results, encounter notes, upcoming appointments, etc.  Non-urgent messages can be sent to your provider as well.   To learn more about what you can do with MyChart, go to [email or web address].    Your next appointment:   12 month(s)  The format for your next appointment:   In Person  Provider:   Chrystie Nose, MD {   Please check your BP at home -- call with readings in about 2 weeks  HOW TO TAKE YOUR BLOOD PRESSURE: Rest 5 minutes before taking your blood pressure. Dont smoke or drink caffeinated beverages for at least 30 minutes before. Take your blood pressure before (not after) you eat. Sit comfortably with your back supported and both feet on the floor (dont cross your legs). Elevate your arm to heart level on a table or a desk. Use the proper sized cuff. It should fit smoothly and snugly around your bare upper arm. There should be enough room to slip a fingertip under the cuff. The bottom edge of the cuff should be 1 inch above the  crease of the elbow. Ideally, take 3 measurements at one sitting and record the average.

## 2021-12-15 ENCOUNTER — Telehealth: Payer: Self-pay | Admitting: Internal Medicine

## 2021-12-15 NOTE — Telephone Encounter (Signed)
Received home BP readings from patient, via mail. Placed in MD mail for review

## 2021-12-16 NOTE — Telephone Encounter (Addendum)
BP readings reviewed by MD - noted they are OK Ranges mostly 120s-130s -- occasional 140s and isolated 151/86 (1/17)

## 2021-12-30 ENCOUNTER — Other Ambulatory Visit: Payer: Self-pay | Admitting: Internal Medicine

## 2022-01-06 DIAGNOSIS — F17210 Nicotine dependence, cigarettes, uncomplicated: Secondary | ICD-10-CM | POA: Diagnosis not present

## 2022-01-06 DIAGNOSIS — J22 Unspecified acute lower respiratory infection: Secondary | ICD-10-CM | POA: Diagnosis not present

## 2022-01-06 DIAGNOSIS — Z20822 Contact with and (suspected) exposure to covid-19: Secondary | ICD-10-CM | POA: Diagnosis not present

## 2022-04-06 ENCOUNTER — Other Ambulatory Visit: Payer: Self-pay | Admitting: *Deleted

## 2022-04-06 DIAGNOSIS — I6523 Occlusion and stenosis of bilateral carotid arteries: Secondary | ICD-10-CM

## 2022-04-13 ENCOUNTER — Ambulatory Visit (HOSPITAL_COMMUNITY)
Admission: RE | Admit: 2022-04-13 | Discharge: 2022-04-13 | Disposition: A | Discharge status: Home / Self Care | Payer: Medicare Other | Source: Ambulatory Visit | Attending: Vascular Surgery | Admitting: Vascular Surgery

## 2022-04-13 ENCOUNTER — Ambulatory Visit: Payer: Medicare Other | Admitting: Physician Assistant

## 2022-04-13 VITALS — BP 145/77 | HR 47 | Temp 97.3°F | Resp 18 | Ht 69.75 in | Wt 161.4 lb

## 2022-04-13 DIAGNOSIS — I6523 Occlusion and stenosis of bilateral carotid arteries: Secondary | ICD-10-CM | POA: Diagnosis not present

## 2022-04-13 NOTE — Progress Notes (Signed)
Office Note     CC:  follow up Requesting Provider:  Assunta Found, MD  HPI: Shawn [name]. is a 76 y.o. (03/13/1946) male who presents for surveillance of carotid artery stenosis.  He had a left carotid endarterectomy by [name] in 2010 due to symptomatic lesion causing a TIA with expressive aphasia.  He denies any diagnosis of CVA or TIA since last office visit 1 year ago.  He also denies any strokelike symptoms including slurring speech, changes in vision, or one-sided weakness.  He has not had any recent surgery or hospitalizations in the past year.  He follows regularly with his PCP on an annual basis for management of hyperlipidemia.  He is an everyday smoker with no interest in quitting.  He takes an aspirin and a statin daily.  He still works daily as a Nutritional therapist.  Past Medical History:  Diagnosis Date   Arthritis    DDD lower spine   Atrial fibrillation (HCC)    atrial flutter   Carotid artery occlusion    Coronary artery disease    History of nuclear stress test 08/2009   bruce myoview; normal pattern of perfusion; low risk    Hyperlipidemia    Hypotension 04/11/2012   Kidney stones about 2000, and feb 2015   passed the 2015 stone   PAD (peripheral artery disease) (HCC)    Peripheral arterial disease (HCC)    Pneumonia    hx of    Shortness of breath    SSS (sick sinus syndrome) (HCC) 04/11/2012    Past Surgical History:  Procedure Laterality Date   CARDIAC CATHETERIZATION  08/2009   mod 2 vessel disease (L main 20-30% distal taper, LAD 50% stenosis in prox protion, diagonal branch 60% segmental stenosis, Cfx hsa 60-70% eccentric stenosis in AV groove Cfx)    CAROTID ENDARTERECTOMY Left May 21, 2009   CE , Dr. Hildred Alamin. [place]   CYSTOSCOPY/RETROGRADE/URETEROSCOPY/STONE EXTRACTION WITH BASKET  about 2000   TRANSTHORACIC ECHOCARDIOGRAM  03/2012   EF 55-60%, normal LV systolic function; mild MR; LA mod dilated    Social History   Socioeconomic History    Marital status: Married    Spouse name: Not on file   Number of children: Not on file   Years of education: Not on file   Highest education level: Not on file  Occupational History   Not on file  Tobacco Use   Smoking status: Every Day    Packs/day: 2.00    Years: 50.00    Total pack years: 100.00    Types: Cigarettes   Smokeless tobacco: Never   Tobacco comments:    smokes 1 ppd (02/05/15)  Substance and Sexual Activity   Alcohol use: No    Alcohol/week: 0.0 standard drinks of alcohol   Drug use: No   Sexual activity: Not on file  Other Topics Concern   Not on file  Social History Narrative   Not on file   Social Determinants of Health   Financial Resource Strain: Not on file  Food Insecurity: Not on file  Transportation Needs: Not on file  Physical Activity: Not on file  Stress: Not on file  Social Connections: Not on file  Intimate Partner Violence: Not on file    Family History  Problem Relation Age of Onset   Hypertension Mother    Hypertension Father     Current Outpatient Medications  Medication Sig Dispense Refill   ALPRAZolam (XANAX) 0.5 MG tablet Take 0.5 mg  by mouth at bedtime as needed for anxiety.      aspirin 81 MG tablet Take 81 mg by mouth daily.     betamethasone valerate (VALISONE) 0.1 % cream Apply 1 application topically daily as needed (rash).      Cyanocobalamin (VITAMIN B-12 PO) Take 1 tablet by mouth daily.     pravastatin (PRAVACHOL) 40 MG tablet TAKE 1 TABLET BY MOUTH AT BEDTIME FOR CHOLESTEROL 90 tablet 3   No current facility-administered medications for this visit.    Allergies  Allergen Reactions   Aleve [Naproxen Sodium] Anaphylaxis and Shortness Of Breath   Penicillins Hives and Itching   Sulfa Antibiotics Hives     REVIEW OF SYSTEMS:   [X]  denotes positive finding, [ ]  denotes negative finding Cardiac  Comments:  Chest pain or chest pressure:    Shortness of breath upon exertion:    Short of breath when lying flat:     Irregular heart rhythm:        Vascular    Pain in calf, thigh, or hip brought on by ambulation:    Pain in feet at night that wakes you up from your sleep:     Blood clot in your veins:    Leg swelling:         Pulmonary    Oxygen at home:    Productive cough:     Wheezing:         Neurologic    Sudden weakness in arms or legs:     Sudden numbness in arms or legs:     Sudden onset of difficulty speaking or slurred speech:    Temporary loss of vision in one eye:     Problems with dizziness:         Gastrointestinal    Blood in stool:     Vomited blood:         Genitourinary    Burning when urinating:     Blood in urine:        Psychiatric    Major depression:         Hematologic    Bleeding problems:    Problems with blood clotting too easily:        Skin    Rashes or ulcers:        Constitutional    Fever or chills:      PHYSICAL EXAMINATION:  Vitals:   04/13/22 1407 04/13/22 1410  BP: 135/79 (!) 145/77  Pulse: (!) 47   Resp: 18   Temp: (!) 97.3 F (36.3 C)   TempSrc: Temporal   SpO2: 99%   Weight: 161 lb 6.4 oz (73.2 kg)   Height: 5' 9.75" (1.772 m)     General:  WDWN in NAD; vital signs documented above Gait: Not observed HENT: WNL, normocephalic Pulmonary: normal non-labored breathing , without Rales, rhonchi,  wheezing Cardiac: regular HR Abdomen: soft, NT, no masses Skin: without rashes Vascular Exam/Pulses:  Right Left  Radial 2+ (normal) 2+ (normal)   Extremities: without ischemic changes, without Gangrene , without cellulitis; without open wounds;  Musculoskeletal: no muscle wasting or atrophy  Neurologic: A&O X 3;  CN grossly intact Psychiatric:  The pt has Normal affect.   Non-Invasive Vascular Imaging:   B ICA 1-39% stenosis    ASSESSMENT/PLAN:: 76 y.o. male here for follow up for carotid artery stenosis   -Subjectively, patient has not experienced any strokelike symptoms since last office visit -Carotid duplex shows  stable mild carotid stenosis  of bilateral internal carotid arteries.  Left carotid endarterectomy site still remains widely patent without any significant recurrent stenosis. -Recheck carotid duplex in 2 years -Continue aspirin and statin daily -Discussed smoking cessation however patient currently has no interest in quitting   Emilie Rutter, PA-C [facility] [phone]  Clinic MD:   Lenell Antu

## 2022-04-28 DIAGNOSIS — H6091 Unspecified otitis externa, right ear: Secondary | ICD-10-CM | POA: Diagnosis not present

## 2022-08-22 DIAGNOSIS — J019 Acute sinusitis, unspecified: Secondary | ICD-10-CM | POA: Diagnosis not present

## 2022-10-04 DIAGNOSIS — I6522 Occlusion and stenosis of left carotid artery: Secondary | ICD-10-CM | POA: Diagnosis not present

## 2022-10-04 DIAGNOSIS — E782 Mixed hyperlipidemia: Secondary | ICD-10-CM | POA: Diagnosis not present

## 2022-10-04 DIAGNOSIS — I4891 Unspecified atrial fibrillation: Secondary | ICD-10-CM | POA: Diagnosis not present

## 2022-10-04 DIAGNOSIS — Z1211 Encounter for screening for malignant neoplasm of colon: Secondary | ICD-10-CM | POA: Diagnosis not present

## 2022-10-04 DIAGNOSIS — Z0000 Encounter for general adult medical examination without abnormal findings: Secondary | ICD-10-CM | POA: Diagnosis not present

## 2022-10-04 DIAGNOSIS — Z6822 Body mass index (BMI) 22.0-22.9, adult: Secondary | ICD-10-CM | POA: Diagnosis not present

## 2022-11-30 DIAGNOSIS — U071 COVID-19: Secondary | ICD-10-CM | POA: Diagnosis not present

## 2023-09-02 ENCOUNTER — Encounter: Payer: Self-pay | Admitting: Internal Medicine

## 2023-09-02 ENCOUNTER — Ambulatory Visit: Payer: Medicare Other | Attending: Internal Medicine | Admitting: Internal Medicine

## 2023-09-02 VITALS — BP 170/94 | HR 48 | Ht 69.0 in | Wt 167.0 lb

## 2023-09-02 DIAGNOSIS — R5383 Other fatigue: Secondary | ICD-10-CM | POA: Diagnosis not present

## 2023-09-02 DIAGNOSIS — J449 Chronic obstructive pulmonary disease, unspecified: Secondary | ICD-10-CM | POA: Diagnosis not present

## 2023-09-02 DIAGNOSIS — I10 Essential (primary) hypertension: Secondary | ICD-10-CM | POA: Diagnosis not present

## 2023-09-02 DIAGNOSIS — I480 Paroxysmal atrial fibrillation: Secondary | ICD-10-CM | POA: Diagnosis not present

## 2023-09-02 MED ORDER — AMLODIPINE BESYLATE 2.5 MG PO TABS: 2.5000 mg | ORAL_TABLET | Freq: Every day | ORAL | 3 refills | Status: DC

## 2023-09-02 NOTE — Patient Instructions (Signed)
Medication Instructions:  Start Amlodipine 2.5 mg daily Continue all other medications *If you need a refill on your cardiac medications before your next appointment, please call your pharmacy*   Lab Work: None  If you have labs (blood work) drawn today and your tests are completely normal, you will receive your results only by: MyChart Message (if you have MyChart) OR A paper copy in the mail If you have any lab test that is abnormal or we need to change your treatment, we will call you to review the results.   Testing/Procedures: None    Follow-Up: At [facility], you and your health needs are our priority.  As part of our continuing mission to provide you with exceptional heart care, we have created designated Provider Care Teams.  These Care Teams include your primary Cardiologist (physician) and Advanced Practice Providers (APPs -  Physician Assistants and Nurse Practitioners) who all work together to provide you with the care you need, when you need it.  We recommend signing up for the patient portal called "MyChart".  Sign up information is provided on this After Visit Summary.  MyChart is used to connect with patients for Virtual Visits (Telemedicine).  Patients are able to view lab/test results, encounter notes, upcoming appointments, etc.  Non-urgent messages can be sent to your provider as well.   To learn more about what you can do with MyChart, go to [email or web address].    Your next appointment:   3 month(s)  Provider:   Chrystie Nose, MD     Take blood pressure daily at home and keep a log bring reading to next appointment

## 2023-09-02 NOTE — Progress Notes (Unsigned)
OFFICE NOTE  Chief Complaint:  Routine follow-up, progressive fatigue and exercise intolerance  Primary Care Physician: Assunta Found, MD  HPI:  Shawn Kick Chancellor Montez Hageman. is a pleasant 77 year old gentleman with a history of atrial flutter with rapid ventricular response. He converted to sinus with 2 occurrences. He was seen by EP and recommended amiodarone, which he had been tolerating well. At his last visit, I decreased that. He has also been on Xarelto with some nose bleeding; however, that has improved. He had been using Afrin and I have prescribed him for a Nasonex spray, which he alternated to try to get him off of the aspirin, which most likely was causing a vasomotor rhinitis. He has had no further episodes of epistaxis. He is doing fairly well on amiodarone; however, today we discussed the possible long-term toxicity of this medicine and how it would be more ideal for him to not be on it, given his younger age.  At his last office visit, we discussed possibly changing him off of amiodarone due to the risk of toxicity. I recommended multifactoral ever he cannot afford this. We also talked about changing to Xarelto however that was too expensive for him. He is currently gone [name] to amiodarone and aspirin. He does not have any recurrent A. fib that he or I are aware of. Unfortunately continues to smoke.  [name] for followup. He is feeling quite well. He did undergo pulmonary function testing which interestingly showed no evidence of COPD, despite his long smoking history. 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 Based on these findings we discontinued his amiodarone. He is maintaining sinus rhythm today.  I saw Shawn [name] in the office today. Overall he is doing well. He's had no recurrent A. fib that were aware of. [name] He had taken himself off of Xarelto due  to nosebleeds. He's currently on aspirin. His CHADSVASC score is 2, therefore anticoagulation with warfarin or a DOAC is indicated for stroke reduction. We discussed this at length today, but he is concerned about ongoing bleeding risks and wishes to stay on aspirin.  I saw Shawn [name] [name] in the office today for follow-up. Overall he is without complaints. He denies any recurrent A. fib in EKG shows sinus bradycardia today. He's only on aspirin, despite a CHADSVASC or up to. This is per his choice as he had significant nosebleeds on anticoagulation and discontinued it. He understands that he may need to go [name] on anticoagulation if he has recurrent A. fib. He seems to be tolerating pravastatin and his had excellent cholesterol control. I reviewed recent laboratory work from his primary care provider which shows good control. He denies any chest pain or worsening shortness of breath. Unfortunately continues to smoke. I did note that his primary care provider had ordered a CT scan of the chest and abdomen in 2015. This was abnormal and the fact that were scattered subcentimeter pulmonary nodules. No follow-up is occurred, but it was recommended at 6 months for a high risk patient.  07/26/2016  Shawn [name] returns today for follow-up. Overall he feels like he is doing well. He denies any chest pain or worsening shortness of breath. The pressures well-controlled today. EKG shows a sinus bradycardia at 52. He continues to be off of anticoagulation despite a CHADSVASC score of 2. Took himself off of Xarelto due to nosebleeds in the  past.  08/03/2017  Shawn [name] returns today for follow-up. 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 He denies any worsening shortness of breath or chest pain. He says is cut [name] his smoking a lot but still smokes about 8-10 cigarettes a day. He has a history of PAF and was on amiodarone however that was discontinued. Recently he's had no recurrent atrial fibrillation and he is  aware of. He has a dyslipidemia history is well and was intolerant to atorvastatin. He's not had any recent lipid testing but is on pravastatin 40 mg. There is a history of moderate coronary artery disease which is nonobstructive. He also has asymptomatic bradycardia which is been stable. Heart rate today is 48.  08/02/2018  Shawn [name] is seen today in follow-up.  Overall he is doing well denies any chest pain or shortness of breath.  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  He is cut [name] on his smoking but still smokes.  He has a history of PAF but has had no recurrence.  He is not on anticoagulation due to history of nosebleeds but is maintained sinus rhythm recently and is in sinus rhythm today.  His dyslipidemia has been well controlled.  08/15/2019  Shawn [name] returns today for follow-up.  Overall he is without complaints.  He says he is fairly active and continues to do yard work.  He denies any chest pain or worsening shortness of breath.  Blood pressures well controlled today.  His weight has been stable.  He takes very little medication but has had fairly well-controlled lipid profile on a statin and takes a low-dose aspirin.  EKG shows a sinus bradycardia at 45.  He reports that his heart rate does increase with exercise and denies any symptoms concerning for chronotropic incompetence.  08/25/2020  Shawn [name] is seen today in annual follow-up.  He continues to be active.  He still working as a Nutritional therapist.  He says he is asymptomatic.  He very rarely gets fatigue.  He does not have any significant or worsening shortness of breath.  He is known to have severe COPD which has been seen numerous times based on imaging in the past.  He is also continuing smoker with 100 pack years of smoking.  He says he has not been able to quit.  His last CT was in 2016 which showed stable scattered pulmonary nodules and no follow-up CT was recommended.  Despite this though  because he continues to smoke, he is at increased risk for pulmonary neoplasm.  I have previously recommended referral to pulmonary however he continues to decline.  He understands that he needs to stop smoking as the most important way to decrease the likelihood of worsening lung disease.  He thinks he may have had a chest x-ray earlier this year from his primary care provider.  I do not have any evidence to see that.  He denies any chest pain.  His lipids have been reasonably well controlled with an LDL of 84 on pravastatin.  He is also on low-dose aspirin but takes no other medications regularly.  11/09/2021  Shawn [name] returns today for routine follow-up.  Overall he says he is doing well.  He denies any worsening shortness of breath.  Despite having severe COPD he is not been able to quit smoking.  He has not had any respiratory illnesses winter which is good.  He denies any palpitations or recurrent A. fib.  Blood pressure was  a little elevated today needs to be reassessed at home.  He is not currently on any medicine for that.  He has not been on any anticoagulation for A. fib in the past due to issues with nosebleeds as per his preference.  He does take pravastatin and LDL was 74 in October of this year.  He does continue on low-dose aspirin.  09/02/2023  Shawn [name] returns today for follow-up.  It has been a little over a year since I have seen him.  He reports that he has had some progressive fatigue and exercise intolerance.  He denies any A-fib.  Blood pressure initially was elevated however improved on recheck.  Of note he does have a history of coronary artery disease in the past which was mild to moderate and nonobstructive by cath in 2010.  His last stress testing was in 2013 and was negative for ischemia.  He is frankly denies any angina.  He is quite bradycardic today although has a history of that.  Heart rate is 48.  PMHx:  Past Medical History:  Diagnosis Date   Arthritis     DDD lower spine   Atrial fibrillation (HCC)    atrial flutter   Carotid artery occlusion    Coronary artery disease    History of nuclear stress test 08/2009   bruce myoview; normal pattern of perfusion; low risk    Hyperlipidemia    Hypotension 04/11/2012   Kidney stones about 2000, and feb 2015   passed the 2015 stone   PAD (peripheral artery disease) (HCC)    Peripheral arterial disease (HCC)    Pneumonia    hx of    Shortness of breath    SSS (sick sinus syndrome) (HCC) 04/11/2012    Past Surgical History:  Procedure Laterality Date   CARDIAC CATHETERIZATION  08/2009   mod 2 vessel disease (L main 20-30% distal taper, LAD 50% stenosis in prox protion, diagonal branch 60% segmental stenosis, Cfx hsa 60-70% eccentric stenosis in AV groove Cfx)    CAROTID ENDARTERECTOMY Left May 21, 2009   CE , Dr. Hildred Alamin. [place]   CYSTOSCOPY/RETROGRADE/URETEROSCOPY/STONE EXTRACTION WITH BASKET  about 2000   TRANSTHORACIC ECHOCARDIOGRAM  03/2012   EF 55-60%, normal LV systolic function; mild MR; LA mod dilated    FAMHx:  Family History  Problem Relation Age of Onset   Hypertension Mother    Hypertension Father     SOCHx:   reports that he has been smoking cigarettes. He has a 100 pack-year smoking history. He has never used smokeless tobacco. He reports that he does not drink alcohol and does not use drugs.  ALLERGIES:  Allergies  Allergen Reactions   Aleve [Naproxen Sodium] Anaphylaxis and Shortness Of Breath   Penicillins Hives and Itching   Sulfa Antibiotics Hives    ROS: Pertinent items noted in HPI and remainder of comprehensive ROS otherwise negative.  HOME MEDS: Current Outpatient Medications  Medication Sig Dispense Refill   aspirin 81 MG tablet Take 81 mg by mouth daily.     betamethasone valerate (VALISONE) 0.1 % cream Apply 1 application topically daily as needed (rash).      Cyanocobalamin (VITAMIN B-12 PO) Take 1 tablet by mouth daily.     pravastatin (PRAVACHOL) 40  MG tablet TAKE 1 TABLET BY MOUTH AT BEDTIME FOR CHOLESTEROL 90 tablet 3   ALPRAZolam (XANAX) 0.5 MG tablet Take 0.5 mg by mouth at bedtime as needed for anxiety.  (Patient not taking: Reported on 09/02/2023)  No current facility-administered medications for this visit.    LABS/IMAGING: No results found for this or any previous visit (from the past 48 hour(s)). No results found.  VITALS: BP (!) 170/94 (BP Location: Left Arm, Patient Position: Sitting, Cuff Size: Normal)   Pulse (!) 48   Ht 5\' 9"  (1.753 m)   Wt 167 lb (75.8 kg)   SpO2 97%   BMI 24.66 kg/m   EXAM: General appearance: alert and no distress Neck: no carotid bruit and no JVD Lungs: clear to auscultation bilaterally Heart: Regular bradycardia Abdomen: soft, non-tender; bowel sounds normal; no masses,  no organomegaly Extremities: extremities normal, atraumatic, no cyanosis or edema Pulses: 2+ and symmetric Skin: Skin color, texture, turgor normal. No rashes or lesions Neurologic: Alert and oriented X 3, normal strength and tone. Normal symmetric reflexes. Normal coordination and gait Psych: Pleasant, normal  EKG: EKG Interpretation Date/Time:  Friday September 02 2023 09:36:28 EST Ventricular Rate:  48 PR Interval:  170 QRS Duration:  84 QT Interval:  440 QTC Calculation: 393 R Axis:   -33  Text Interpretation: Sinus bradycardia Left axis deviation Cannot rule out Anterior infarct , age undetermined When compared with ECG of 15-Apr-2012 08:17, Abberant conduction is no longer Present QRS axis Shifted left Confirmed by Shawn [name] [phone]) on 09/02/2023 10:10:20 AM    ASSESSMENT: Progressive fatigue and exercise intolerance Paroxysmal atrial fibrillation - amiodarone d/c'd due to abnormal DLCO Chronic anticoagulation- CHADS2VASC score of 2 (patient took himself off of Xarelto due to nosebleeds) Dyslipidemia-intolerance to atorvastatin History of carotid artery disease status post left carotid  endarterectomy CAD with moderate disease, non-obstructive Asymptomatic bradycardia Subcentimeter pulmonary nodules in a smoker Severe COPD  PLAN: 1.   Shawn [name] says he has had some progressive fatigue and exercise intolerance.  Blood pressure initially was elevated although improved somewhat.  He does have a remote history of coronary artery disease which is mild to moderate by cath and nonobstructive in 2010.  His last ischemic evaluation was in 2013.  He is noted to have some bradycardia but that is not unusual for him.  I am not sure that it is making him symptomatic.  He is not on any AV nodal blocking agents.  In fact he is only on aspirin and statin essentially.  His last lipid profile showed an LDL of 79.  I have recommended proceeding with an ischemic evaluation.  I am concerned about the possibility of progressive coronary disease and possible multivessel coronary disease as a cause of his progressive fatigue and exercise intolerance.  I recommended a cardiac PET scan, however he did want to wait and see if it was possibly related to his blood pressure. As his blood pressure is still somewhat elevated, would advise starting amlodipine 2.5 mg daily.  Plan follow-up with me in about 3 months.  If his symptoms persist or worsen, I would strongly suggest ischemic evaluation.  Chrystie Nose, MD, [facility], FACP  [facility]  Medical Director of the [facility] Lipid [facility] &  Cardiovascular Risk Reduction Clinic Diplomate of the American Board of Clinical Lipidology Attending Cardiologist  Direct Dial: [phone]  Fax: [phone]  Website:  [email or web address] Nicely Shawn [name] 09/02/2023, 10:10 AM

## 2023-09-14 DIAGNOSIS — Z1212 Encounter for screening for malignant neoplasm of rectum: Secondary | ICD-10-CM | POA: Diagnosis not present

## 2023-09-14 DIAGNOSIS — Z1211 Encounter for screening for malignant neoplasm of colon: Secondary | ICD-10-CM | POA: Diagnosis not present

## 2023-09-26 ENCOUNTER — Ambulatory Visit: Payer: Medicare Other | Admitting: Pulmonary Disease

## 2023-10-10 DIAGNOSIS — I6522 Occlusion and stenosis of left carotid artery: Secondary | ICD-10-CM | POA: Diagnosis not present

## 2023-10-10 DIAGNOSIS — I4891 Unspecified atrial fibrillation: Secondary | ICD-10-CM | POA: Diagnosis not present

## 2023-10-10 DIAGNOSIS — Z0000 Encounter for general adult medical examination without abnormal findings: Secondary | ICD-10-CM | POA: Diagnosis not present

## 2023-10-10 DIAGNOSIS — E7849 Other hyperlipidemia: Secondary | ICD-10-CM | POA: Diagnosis not present

## 2023-10-10 DIAGNOSIS — R7309 Other abnormal glucose: Secondary | ICD-10-CM | POA: Diagnosis not present

## 2023-10-10 DIAGNOSIS — I10 Essential (primary) hypertension: Secondary | ICD-10-CM | POA: Diagnosis not present

## 2023-10-10 DIAGNOSIS — Z6822 Body mass index (BMI) 22.0-22.9, adult: Secondary | ICD-10-CM | POA: Diagnosis not present

## 2023-10-10 DIAGNOSIS — E782 Mixed hyperlipidemia: Secondary | ICD-10-CM | POA: Diagnosis not present

## 2023-10-10 DIAGNOSIS — Z1211 Encounter for screening for malignant neoplasm of colon: Secondary | ICD-10-CM | POA: Diagnosis not present

## 2023-10-10 DIAGNOSIS — R5383 Other fatigue: Secondary | ICD-10-CM | POA: Diagnosis not present

## 2023-11-25 ENCOUNTER — Telehealth: Payer: Self-pay | Admitting: Internal Medicine

## 2023-11-25 NOTE — Telephone Encounter (Signed)
Pt c/o medication issue:  1. Name of Medication: amLODipine (NORVASC) 2.5 MG tablet   2. How are you currently taking this medication (dosage and times per day)? As written   3. Are you having a reaction (difficulty breathing--STAT)? No   4. What is your medication issue? Pt called in to inform Dr. Rennis Golden that his PCP changed this medication to 10mg .

## 2023-11-28 NOTE — Addendum Note (Signed)
Addended by: Lindell Spar on: 11/28/2023 08:09 AM   Modules accepted: Orders

## 2023-11-28 NOTE — Telephone Encounter (Signed)
Medication list updated to reflect dose change

## 2023-12-07 ENCOUNTER — Ambulatory Visit: Payer: Medicare Other | Admitting: General Practice

## 2024-03-21 ENCOUNTER — Other Ambulatory Visit: Payer: Self-pay | Admitting: Surgery

## 2024-03-21 DIAGNOSIS — I6523 Occlusion and stenosis of bilateral carotid arteries: Secondary | ICD-10-CM

## 2024-04-09 ENCOUNTER — Ambulatory Visit: Attending: Surgery | Admitting: Physician Assistant

## 2024-04-09 ENCOUNTER — Ambulatory Visit (HOSPITAL_COMMUNITY)
Admission: RE | Admit: 2024-04-09 | Discharge: 2024-04-09 | Disposition: A | Discharge status: Home / Self Care | Source: Ambulatory Visit | Attending: Surgery | Admitting: Surgery

## 2024-04-09 VITALS — BP 171/77 | HR 50 | Temp 97.7°F | Ht 69.0 in | Wt 160.3 lb

## 2024-04-09 DIAGNOSIS — I6523 Occlusion and stenosis of bilateral carotid arteries: Secondary | ICD-10-CM | POA: Diagnosis not present

## 2024-04-09 NOTE — Progress Notes (Signed)
 Office Note     CC:  follow up Requesting Provider:  Minus Amel, MD  HPI: Shawn [name]. is a 78 y.o. ([birth year]) male who presents for surveillance follow up of carotid artery stenosis. He has remote history of left CEA by Dr. Nolene Baumgarten in 2010 for symptomatic stenosis. On follow up he has been doing well without any TIA or stroke like symptoms.   Today he reports overall doing well. He has had some generalized fatigue over the past year and gets a little more short winded. 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 He otherwise denies any visual changes, slurred speech, facial dropping, unilateral upper or lower extremity weakness or numbness. He does report occasional tingling in his great toes. No pain on ambulation or rest. No tissue loss. He is medically managed on  Aspirin  and Statin. He continues to smoke about 1.5 ppd. Says he has tried everything and just can't quit.   Past Medical History:  Diagnosis Date   Arthritis    DDD lower spine   Atrial fibrillation (HCC)    atrial flutter   Carotid artery occlusion    Coronary artery disease    History of nuclear stress test 08/2009   bruce myoview ; normal pattern of perfusion; low risk    Hyperlipidemia    Hypotension 04/11/2012   Kidney stones about 2000, and feb 2015   passed the 2015 stone   PAD (peripheral artery disease) (HCC)    Peripheral arterial disease (HCC)    Pneumonia    hx of    Shortness of breath    SSS (sick sinus syndrome) (HCC) 04/11/2012    Past Surgical History:  Procedure Laterality Date   CARDIAC CATHETERIZATION  08/2009   mod 2 vessel disease (L main 20-30% distal taper, LAD 50% stenosis in prox protion, diagonal branch 60% segmental stenosis, Cfx hsa 60-70% eccentric stenosis in AV groove Cfx)    CAROTID ENDARTERECTOMY Left May 21, 2009   CE , Dr. Sabino Crafts. [place]   CYSTOSCOPY/RETROGRADE/URETEROSCOPY/STONE EXTRACTION WITH BASKET   about 2000   TRANSTHORACIC ECHOCARDIOGRAM  03/2012   EF 55-60%, normal LV systolic function; mild MR; LA mod dilated    Social History   Socioeconomic History   Marital status: Married    Spouse name: Not on file   Number of children: Not on file   Years of education: Not on file   Highest education level: Not on file  Occupational History   Not on file  Tobacco Use   Smoking status: Every Day    Current packs/day: 2.00    Average packs/day: 2.0 packs/day for 50.0 years (100.0 ttl pk-yrs)    Types: Cigarettes   Smokeless tobacco: Never   Tobacco comments:    smokes 1 ppd (02/05/15)  Substance and Sexual Activity   Alcohol use: No    Alcohol/week: 0.0 standard drinks of alcohol   Drug use: No   Sexual activity: Not on file  Other Topics Concern   Not on file  Social History Narrative   Not on file   Social Drivers of Health   Financial Resource Strain: Not on file  Food Insecurity: Not on file  Transportation Needs: Not on file  Physical Activity: Not on file  Stress: Not on file  Social Connections: Not on file  Intimate Partner Violence: Not on file   Family History  Problem Relation Age of  Onset   Hypertension Mother    Hypertension Father     Current Outpatient Medications  Medication Sig Dispense Refill   ALPRAZolam  (XANAX ) 0.5 MG tablet Take 0.5 mg by mouth at bedtime as needed for anxiety.      amLODipine  (NORVASC ) 10 MG tablet Take 10 mg by mouth daily.     aspirin  81 MG tablet Take 81 mg by mouth daily.     betamethasone valerate (VALISONE) 0.1 % cream Apply 1 application topically daily as needed (rash).      Cyanocobalamin (VITAMIN B-12 PO) Take 1 tablet by mouth daily.     pravastatin  (PRAVACHOL ) 40 MG tablet TAKE 1 TABLET BY MOUTH AT BEDTIME FOR CHOLESTEROL 90 tablet 3   No current facility-administered medications for this visit.    Allergies  Allergen Reactions   Aleve [Naproxen Sodium] Anaphylaxis and Shortness Of Breath   Penicillins  Hives and Itching   Sulfa Antibiotics Hives     REVIEW OF SYSTEMS:  [X]  denotes positive finding, [ ]  denotes negative finding Cardiac  Comments:  Chest pain or chest pressure:    Shortness of breath upon exertion:    Short of breath when lying flat:    Irregular heart rhythm:        Vascular    Pain in calf, thigh, or hip brought on by ambulation:    Pain in feet at night that wakes you up from your sleep:     Blood clot in your veins:    Leg swelling:         Pulmonary    Oxygen at home:    Productive cough:     Wheezing:         Neurologic    Sudden weakness in arms or legs:     Sudden numbness in arms or legs:     Sudden onset of difficulty speaking or slurred speech:    Temporary loss of vision in one eye:     Problems with dizziness:         Gastrointestinal    Blood in stool:     Vomited blood:         Genitourinary    Burning when urinating:     Blood in urine:        Psychiatric    Major depression:         Hematologic    Bleeding problems:    Problems with blood clotting too easily:        Skin    Rashes or ulcers:        Constitutional    Fever or chills:      PHYSICAL EXAMINATION:  Vitals:   04/09/24 1417 04/09/24 1420  BP: (!) 163/87 (!) 171/77  Pulse: (!) 50   Temp: 97.7 F (36.5 C)   TempSrc: Temporal   SpO2: 97%   Weight: 160 lb 4.8 oz (72.7 kg)   Height: 5' 9 (1.753 m)     General:  WDWN in NAD; vital signs documented above Gait: Normal HENT: WNL, normocephalic Pulmonary: normal non-labored breathing Cardiac: regular HR Abdomen: soft Vascular Exam/Pulses: 2+ radial, 2+ DP pulses bilaterally Extremities: without ischemic changes, without Gangrene , without cellulitis; without open wounds;  Musculoskeletal: no muscle wasting or atrophy  Neurologic: A&O X 3 Psychiatric:  The pt has Normal affect.   Non-Invasive Vascular Imaging:   VAS US  Carotid Duplex: Summary:  Right Carotid: Velocities in the right ICA are consistent  with a 1-39% stenosis.  Left Carotid: Velocities in the left ICA are consistent with a 1-39% stenosis.   Vertebrals: Bilateral vertebral arteries demonstrate antegrade flow.  Subclavians: Normal flow hemodynamics were seen in bilateral subclavian arteries.    ASSESSMENT/PLAN:: 78 y.o. male presents for surveillance follow up of carotid artery stenosis. He has remote history of left CEA by Dr. Nolene Baumgarten in 2010 for symptomatic stenosis. On follow up he has been doing well without any TIA or stroke like symptoms. He remains without any associated symptoms. His duplex shows patient ICA bilaterally with 1-39% stenosis. Normal flow in vertebral and subclavian arteries.  -Encourage smoking cessation - encourage him to follow up with PCP regarding fatigue and blood pressure management - continue Aspirin  and statin - follow up in 1 year with repeat carotid duplex   Deneen Finical, PA-C [facility] [phone]  Clinic MD:   Marjory Signs

## 2024-10-29 ENCOUNTER — Ambulatory Visit: Attending: Internal Medicine | Admitting: Internal Medicine

## 2024-10-29 VITALS — BP 158/89 | HR 49 | Ht 72.0 in | Wt 160.0 lb

## 2024-10-29 DIAGNOSIS — J449 Chronic obstructive pulmonary disease, unspecified: Secondary | ICD-10-CM

## 2024-10-29 DIAGNOSIS — I480 Paroxysmal atrial fibrillation: Secondary | ICD-10-CM | POA: Diagnosis not present

## 2024-10-29 DIAGNOSIS — I10 Essential (primary) hypertension: Secondary | ICD-10-CM | POA: Diagnosis not present

## 2024-10-29 DIAGNOSIS — I6523 Occlusion and stenosis of bilateral carotid arteries: Secondary | ICD-10-CM | POA: Diagnosis not present

## 2024-10-29 MED ORDER — AMLODIPINE BESYLATE 10 MG PO TABS: 10.0000 mg | ORAL_TABLET | Freq: Every day | ORAL | 3 refills | Status: AC

## 2024-10-29 MED ORDER — VALSARTAN 160 MG PO TABS: 160.0000 mg | ORAL_TABLET | Freq: Every day | ORAL | 3 refills | Status: DC

## 2024-10-29 NOTE — Patient Instructions (Signed)
 Medication Instructions:  Your physician has recommended you make the following change in your medication:   -Take amlodipine  (norvasc ) 10mg  once daily.  -Start valsartan  (Diovan ) 160mg  once daily.  *If you need a refill on your cardiac medications before your next appointment, please call your pharmacy*  Lab Work: Your physician recommends that you return for lab work in: 1 week for FASTING Lipids, BMET & CBC  If you have labs (blood work) drawn today and your tests are completely normal, you will receive your results only by: MyChart Message (if you have MyChart) OR A paper copy in the mail If you have any lab test that is abnormal or we need to change your treatment, we will call you to review the results. \ Follow-Up: At [facility], you and your health needs are our priority.  As part of our continuing mission to provide you with exceptional heart care, our providers are all part of one team.  This team includes your primary Cardiologist (physician) and Advanced Practice Providers or APPs (Physician Assistants and Nurse Practitioners) who all work together to provide you with the care you need, when you need it.  Your next appointment:   6 month(s)  Provider:   Vinie JAYSON Maxcy, MD    We recommend signing up for the patient portal called MyChart.  Sign up information is provided on this After Visit Summary.  MyChart is used to connect with patients for Virtual Visits (Telemedicine).  Patients are able to view lab/test results, encounter notes, upcoming appointments, etc.  Non-urgent messages can be sent to your provider as well.   To learn more about what you can do with MyChart, go to [email or web address].   Other Instructions Dr. Maxcy has requested that you schedule an appointment with one of our clinical pharmacists for a blood pressure check appointment within the next 6-8 weeks.  If you monitor your blood pressure (BP) at home, please bring your BP cuff and  your BP readings with you to this appointment  HOW TO TAKE YOUR BLOOD PRESSURE: Rest 5 minutes before taking your blood pressure. Dont smoke or drink caffeinated beverages for at least 30 minutes before. Take your blood pressure before (not after) you eat. Sit comfortably with your back supported and both feet on the floor (dont cross your legs). Elevate your arm to heart level on a table or a desk. Use the proper sized cuff. It should fit smoothly and snugly around your bare upper arm. There should be enough room to slip a fingertip under the cuff. The bottom edge of the cuff should be 1 inch above the crease of the elbow. Ideally, take 3 measurements at one sitting and record the average.

## 2024-10-29 NOTE — Progress Notes (Signed)
 "   OFFICE NOTE  Chief Complaint:  Routine follow-up  Primary Care Physician: Marvine Rush, MD  HPI:  Shawn [name]. is a pleasant 79 year old gentleman with a history of atrial flutter with rapid ventricular response. He converted to sinus with 2 occurrences. He was seen by EP and recommended amiodarone , which he had been tolerating well. At his last visit, I decreased that. He has also been on Xarelto  with some nose bleeding; however, that has improved. He had been using Afrin and I have prescribed him for a Nasonex spray, which he alternated to try to get him off of the aspirin , which most likely was causing a vasomotor rhinitis. He has had no further episodes of epistaxis. He is doing fairly well on amiodarone ; however, today we discussed the possible long-term toxicity of this medicine and how it would be more ideal for him to not be on it, given his younger age.  At his last office visit, we discussed possibly changing him off of amiodarone  due to the risk of toxicity. I recommended multifactoral ever he cannot afford this. We also talked about changing to Xarelto  however that was too expensive for him. He is currently gone back to amiodarone  and aspirin . He does not have any recurrent A. fib that he or I are aware of. Unfortunately continues to smoke.  [name] for followup. He is feeling quite well. He did undergo pulmonary function testing which interestingly showed no evidence of COPD, despite his long smoking history. 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 Based on these findings we discontinued his amiodarone . He is maintaining sinus rhythm today.  [name] Overall he is doing well. He's had no recurrent A. fib that were aware of. I take him off of amiodarone  due to a decrease in DLCO. He had taken himself off of Xarelto  due to nosebleeds. He's currently on aspirin . His  CHADSVASC score is 2, therefore anticoagulation with warfarin or a DOAC is indicated for stroke reduction. We discussed this at length today, but he is concerned about ongoing bleeding risks and wishes to stay on aspirin .  [name] Overall he is without complaints. He denies any recurrent A. fib in EKG shows sinus bradycardia today. He's only on aspirin , despite a CHADSVASC or up to. This is per his choice as he had significant nosebleeds on anticoagulation and discontinued it. He understands that he may need to go back on anticoagulation if he has recurrent A. fib. He seems to be tolerating pravastatin  and his had excellent cholesterol control. I reviewed recent laboratory work from his primary care provider which shows good control. He denies any chest pain or worsening shortness of breath. Unfortunately continues to smoke. I did note that his primary care provider had ordered a CT scan of the chest and abdomen in 2015. This was abnormal and the fact that were scattered subcentimeter pulmonary nodules. No follow-up is occurred, but it was recommended at 6 months for a high risk patient.  07/26/2016  Shawn [name] returns today for follow-up. Overall he feels like he is doing well. He denies any chest pain or worsening shortness of breath. The pressures well-controlled today. EKG shows a sinus bradycardia at 52. He continues to be off of anticoagulation despite a CHADSVASC score of 2. Took himself off of Xarelto  due to nosebleeds in the past.  08/03/2017  Shawn [name] returns today for follow-up. 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 He denies any worsening shortness of breath or chest pain. He says is cut back his smoking a lot but still smokes about 8-10 cigarettes a day. He has a history of PAF and was on amiodarone  however that was discontinued. Recently he's had no recurrent atrial fibrillation and he is aware of. He has a dyslipidemia history is well  and was intolerant to atorvastatin . He's not had any recent lipid testing but is on pravastatin  40 mg. There is a history of moderate coronary artery disease which is nonobstructive. He also has asymptomatic bradycardia which is been stable. Heart rate today is 48.  08/02/2018  Shawn [name] is seen today in follow-up.  Overall he is doing well denies any chest pain or shortness of breath.  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  He is cut back on his smoking but still smokes.  He has a history of PAF but has had no recurrence.  He is not on anticoagulation due to history of nosebleeds but is maintained sinus rhythm recently and is in sinus rhythm today.  His dyslipidemia has been well controlled.  08/15/2019  Shawn [name] returns today for follow-up.  Overall he is without complaints.  He says he is fairly active and continues to do yard work.  He denies any chest pain or worsening shortness of breath.  Blood pressures well controlled today.  His weight has been stable.  He takes very little medication but has had fairly well-controlled lipid profile on a statin and takes a low-dose aspirin .  EKG shows a sinus bradycardia at 45.  He reports that his heart rate does increase with exercise and denies any symptoms concerning for chronotropic incompetence.  08/25/2020  Shawn [name] is seen today in annual follow-up.  He continues to be active.  He still working as a nutritional therapist.  He says he is asymptomatic.  He very rarely gets fatigue.  He does not have any significant or worsening shortness of breath.  He is known to have severe COPD which has been seen numerous times based on imaging in the past.  He is also continuing smoker with 100 pack years of smoking.  He says he has not been able to quit.  His last CT was in 2016 which showed stable scattered pulmonary nodules and no follow-up CT was recommended.  Despite this though because he continues to smoke, he is at increased risk  for pulmonary neoplasm.  I have previously recommended referral to pulmonary however he continues to decline.  He understands that he needs to stop smoking as the most important way to decrease the likelihood of worsening lung disease.  He thinks he may have had a chest x-ray earlier this year from his primary care provider.  I do not have any evidence to see that.  He denies any chest pain.  His lipids have been reasonably well controlled with an LDL of 84 on pravastatin .  He is also on low-dose aspirin  but takes no other medications regularly.  11/09/2021  Shawn [name] returns today for routine follow-up.  Overall he says he is doing well.  He denies any worsening shortness of breath.  Despite having severe COPD he is not been able to quit smoking.  He has not had any respiratory illnesses winter which is good.  He denies any palpitations or recurrent A. fib.  Blood pressure was a little elevated today  needs to be reassessed at home.  He is not currently on any medicine for that.  He has not been on any anticoagulation for A. fib in the past due to issues with nosebleeds as per his preference.  He does take pravastatin  and LDL was 74 in October of this year.  He does continue on low-dose aspirin .  09/02/2023  Shawn [name] returns today for follow-up.  It has been a little over a year since I have seen him.  He reports that he has had some progressive fatigue and exercise intolerance.  He denies any A-fib.  Blood pressure initially was elevated however improved on recheck.  Of note he does have a history of coronary artery disease in the past which was mild to moderate and nonobstructive by cath in 2010.  His last stress testing was in 2013 and was negative for ischemia.  He is frankly denies any angina.  He is quite bradycardic today although has a history of that.  Heart rate is 48.  10/29/2024  Shawn [name] returns today for follow-up with his wife.  Overall he says he is doing pretty well.   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  Fortunately stopped and it is bandaged now.  Of note his blood pressure has been elevated.  His primary care provider closed his practice and moved in with another group so he has not had a follow-up yet.  Shawn [name] tells me that his PCP doubled his amlodipine  from 10 to 20 mg which is in excess of the maximal dose.  This means that it is more likely to cause side effects without additional benefit.  Blood pressure was high today at 158/89.  When I saw him last in 2024 he had been complaining of fatigue and other symptoms.  I advised a stress test however he never got that performed and said today he does not ever want to take a stress test again.  Heart rate today was 49 which is around his baseline of 50.  PMHx:  Past Medical History:  Diagnosis Date   Arthritis    DDD lower spine   Atrial fibrillation (HCC)    atrial flutter   Carotid artery occlusion    Coronary artery disease    History of nuclear stress test 08/2009   bruce myoview ; normal pattern of perfusion; low risk    Hyperlipidemia    Hypotension 04/11/2012   Kidney stones about 2000, and feb 2015   passed the 2015 stone   PAD (peripheral artery disease)    Peripheral arterial disease    Pneumonia    hx of    Shortness of breath    SSS (sick sinus syndrome) (HCC) 04/11/2012    Past Surgical History:  Procedure Laterality Date   CARDIAC CATHETERIZATION  08/2009   mod 2 vessel disease (L main 20-30% distal taper, LAD 50% stenosis in prox protion, diagonal branch 60% segmental stenosis, Cfx hsa 60-70% eccentric stenosis in AV groove Cfx)    CAROTID ENDARTERECTOMY Left May 21, 2009   CE , Dr. KYM BRAVO. [place]   CYSTOSCOPY/RETROGRADE/URETEROSCOPY/STONE EXTRACTION WITH BASKET  about 2000   TRANSTHORACIC ECHOCARDIOGRAM  03/2012   EF 55-60%, normal LV systolic function; mild MR; LA mod dilated    FAMHx:   Family History  Problem Relation Age of Onset   Hypertension Mother    Hypertension Father     SOCHx:   reports  that he has been smoking cigarettes. He has a 100 pack-year smoking history. He has never used smokeless tobacco. He reports that he does not drink alcohol and does not use drugs.  ALLERGIES:  Allergies  Allergen Reactions   Aleve [Naproxen Sodium] Anaphylaxis and Shortness Of Breath   Penicillins Hives and Itching   Sulfa Antibiotics Hives    ROS: Pertinent items noted in HPI and remainder of comprehensive ROS otherwise negative.  HOME MEDS: Current Outpatient Medications  Medication Sig Dispense Refill   ALPRAZolam  (XANAX ) 0.5 MG tablet Take 0.5 mg by mouth at bedtime as needed for anxiety.      amLODipine  (NORVASC ) 10 MG tablet Take 10 mg by mouth daily.     aspirin  81 MG tablet Take 81 mg by mouth daily.     betamethasone valerate (VALISONE) 0.1 % cream Apply 1 application topically daily as needed (rash).      Cyanocobalamin (VITAMIN B-12 PO) Take 1 tablet by mouth daily.     pravastatin  (PRAVACHOL ) 40 MG tablet TAKE 1 TABLET BY MOUTH AT BEDTIME FOR CHOLESTEROL 90 tablet 3   No current facility-administered medications for this visit.    LABS/IMAGING: No results found for this or any previous visit (from the past 48 hours). No results found.  VITALS: There were no vitals taken for this visit.  EXAM: General appearance: alert and no distress Neck: no carotid bruit and no JVD Lungs: clear to auscultation bilaterally Heart: Regular bradycardia Abdomen: soft, non-tender; bowel sounds normal; no masses,  no organomegaly Extremities: extremities normal, atraumatic, no cyanosis or edema Pulses: 2+ and symmetric Skin: Skin color, texture, turgor normal. No rashes or lesions Neurologic: Alert and oriented X 3, normal strength and tone. Normal symmetric reflexes. Normal coordination and gait Psych: Pleasant, normal  EKG: EKG  Interpretation Date/Time:  Monday October 29 2024 16:00:52 EST Ventricular Rate:  49 PR Interval:  186 QRS Duration:  86 QT Interval:  440 QTC Calculation: 397 R Axis:   -48  Text Interpretation: Sinus bradycardia Left anterior fascicular block Possible Anterior infarct (cited on or before 02-Sep-2023) When compared with ECG of 02-Sep-2023 09:36, No significant change was found Confirmed by Mona Kent [phone]) on 10/29/2024 4:04:51 PM    ASSESSMENT: Progressive fatigue and exercise intolerance Paroxysmal atrial fibrillation - amiodarone  d/c'd due to abnormal DLCO Chronic anticoagulation- CHADS2VASC score of 2 (patient took himself off of Xarelto  due to nosebleeds) Dyslipidemia-intolerance to atorvastatin  History of carotid artery disease status post left carotid endarterectomy CAD with moderate disease, non-obstructive Asymptomatic bradycardia Subcentimeter pulmonary nodules in a smoker Severe COPD  PLAN: 1.   Mr. Federici denies any worsening chest pain or shortness of breath.  Blood pressure is not well-controlled.  He is on 20 mg amlodipine  which exceeds the maximal dose.  I advise decreasing that to 10 mg will add valsartan  160 mg daily.  He will need repeat labs in a week including a metabolic profile, lipid profile and CBC.  Follow-up otherwise with our hypertension pharmacist in 3 to 4 months for medication titration.  Kent KYM Mona, MD, [facility], FNLA, FACP  [place]  [facility]  Medical Director of the [facility] Diplomate of the American Board of Clinical Lipidology Attending Cardiologist  Direct Dial: [phone]  Fax: [phone]  Website:  [email or web address] Kent BROCKS Shawn [name] 10/29/2024, 4:05 PM "

## 2024-11-07 LAB — BASIC METABOLIC PANEL WITH GFR
BUN/Creatinine Ratio: 11 (ref 10–24)
BUN: 17 mg/dL (ref 8–27)
CO2: 22 mmol/L (ref 20–29)
Calcium: 9.4 mg/dL (ref 8.6–10.2)
Chloride: 104 mmol/L (ref 96–106)
Creatinine, Ser: 1.54 mg/dL — ABNORMAL HIGH (ref 0.76–1.27)
Glucose: 88 mg/dL (ref 70–99)
Potassium: 4.6 mmol/L (ref 3.5–5.2)
Sodium: 141 mmol/L (ref 134–144)
eGFR: 46 mL/min/1.73 — ABNORMAL LOW

## 2024-11-07 LAB — LIPID PANEL
Chol/HDL Ratio: 3.5 ratio (ref 0.0–5.0)
Cholesterol, Total: 160 mg/dL (ref 100–199)
HDL: 46 mg/dL
LDL Chol Calc (NIH): 99 mg/dL (ref 0–99)
Triglycerides: 78 mg/dL (ref 0–149)
VLDL Cholesterol Cal: 15 mg/dL (ref 5–40)

## 2024-11-07 LAB — CBC
Hematocrit: 42.9 % (ref 37.5–51.0)
Hemoglobin: 14.3 g/dL (ref 13.0–17.7)
MCH: 32.2 pg (ref 26.6–33.0)
MCHC: 33.3 g/dL (ref 31.5–35.7)
MCV: 97 fL (ref 79–97)
Platelets: 243 x10E3/uL (ref 150–450)
RBC: 4.44 x10E6/uL (ref 4.14–5.80)
RDW: 12.5 % (ref 11.6–15.4)
WBC: 8.3 x10E3/uL (ref 3.4–10.8)

## 2024-11-12 ENCOUNTER — Ambulatory Visit: Payer: Self-pay | Admitting: Internal Medicine

## 2024-11-12 DIAGNOSIS — I10 Essential (primary) hypertension: Secondary | ICD-10-CM

## 2024-11-14 MED ORDER — VALSARTAN 160 MG PO TABS: 80.0000 mg | ORAL_TABLET | Freq: Every day | ORAL | Status: AC

## 2024-11-14 NOTE — Addendum Note (Signed)
 Addended by: LORING ANDRIETTE HERO on: 11/14/2024 04:25 PM   Modules accepted: Orders

## 2024-11-24 LAB — BASIC METABOLIC PANEL WITH GFR
BUN/Creatinine Ratio: 14 (ref 10–24)
BUN: 21 mg/dL (ref 8–27)
CO2: 22 mmol/L (ref 20–29)
Calcium: 9.7 mg/dL (ref 8.6–10.2)
Chloride: 104 mmol/L (ref 96–106)
Creatinine, Ser: 1.48 mg/dL — ABNORMAL HIGH (ref 0.76–1.27)
Glucose: 86 mg/dL (ref 70–99)
Potassium: 4.4 mmol/L (ref 3.5–5.2)
Sodium: 143 mmol/L (ref 134–144)
eGFR: 49 mL/min/{1.73_m2} — ABNORMAL LOW
